# Patient Record
Sex: Female | Born: 1970 | Race: Black or African American | Hispanic: No | Smoking: Current every day smoker
Health system: Southern US, Community
[De-identification: ages and names within clinical notes are randomized; demographics above are authoritative.]

## PROBLEM LIST (undated history)

## (undated) DIAGNOSIS — M549 Dorsalgia, unspecified: Secondary | ICD-10-CM

## (undated) DIAGNOSIS — E039 Hypothyroidism, unspecified: Secondary | ICD-10-CM

## (undated) DIAGNOSIS — D649 Anemia, unspecified: Secondary | ICD-10-CM

## (undated) DIAGNOSIS — F411 Generalized anxiety disorder: Secondary | ICD-10-CM

## (undated) DIAGNOSIS — E0590 Thyrotoxicosis, unspecified without thyrotoxic crisis or storm: Secondary | ICD-10-CM

## (undated) DIAGNOSIS — F419 Anxiety disorder, unspecified: Secondary | ICD-10-CM

## (undated) DIAGNOSIS — R0602 Shortness of breath: Secondary | ICD-10-CM

## (undated) DIAGNOSIS — R079 Chest pain, unspecified: Secondary | ICD-10-CM

## (undated) DIAGNOSIS — M2550 Pain in unspecified joint: Secondary | ICD-10-CM

## (undated) DIAGNOSIS — H04129 Dry eye syndrome of unspecified lacrimal gland: Secondary | ICD-10-CM

## (undated) DIAGNOSIS — E0500 Thyrotoxicosis with diffuse goiter without thyrotoxic crisis or storm: Secondary | ICD-10-CM

## (undated) DIAGNOSIS — F32A Depression, unspecified: Secondary | ICD-10-CM

## (undated) HISTORY — PX: TUBAL LIGATION: SHX77

## (undated) HISTORY — DX: Chest pain, unspecified: R07.9

## (undated) HISTORY — DX: Dry eye syndrome of unspecified lacrimal gland: H04.129

## (undated) HISTORY — DX: Shortness of breath: R06.02

## (undated) HISTORY — DX: Anemia, unspecified: D64.9

## (undated) HISTORY — DX: Depression, unspecified: F32.A

## (undated) HISTORY — DX: Generalized anxiety disorder: F41.1

## (undated) HISTORY — DX: Anxiety disorder, unspecified: F41.9

## (undated) HISTORY — DX: Pain in unspecified joint: M25.50

## (undated) HISTORY — DX: Thyrotoxicosis with diffuse goiter without thyrotoxic crisis or storm: E05.00

## (undated) HISTORY — DX: Dorsalgia, unspecified: M54.9

## (undated) HISTORY — DX: Thyrotoxicosis, unspecified without thyrotoxic crisis or storm: E05.90

---

## 2013-11-23 DIAGNOSIS — M25519 Pain in unspecified shoulder: Secondary | ICD-10-CM | POA: Insufficient documentation

## 2013-11-30 DIAGNOSIS — E559 Vitamin D deficiency, unspecified: Secondary | ICD-10-CM | POA: Insufficient documentation

## 2016-02-20 DIAGNOSIS — E0590 Thyrotoxicosis, unspecified without thyrotoxic crisis or storm: Secondary | ICD-10-CM | POA: Insufficient documentation

## 2016-07-04 DIAGNOSIS — E0500 Thyrotoxicosis with diffuse goiter without thyrotoxic crisis or storm: Secondary | ICD-10-CM | POA: Insufficient documentation

## 2016-07-04 DIAGNOSIS — H05839 Thyroid orbitopathy, unspecified orbit: Secondary | ICD-10-CM | POA: Insufficient documentation

## 2018-12-04 ENCOUNTER — Other Ambulatory Visit: Payer: Self-pay

## 2018-12-04 ENCOUNTER — Encounter (HOSPITAL_COMMUNITY): Payer: Self-pay | Admitting: *Deleted

## 2018-12-04 ENCOUNTER — Emergency Department (HOSPITAL_COMMUNITY)
Admission: EM | Admit: 2018-12-04 | Discharge: 2018-12-05 | Disposition: A | Discharge status: Home / Self Care | Payer: Federal, State, Local not specified - PPO | Attending: Emergency Medicine | Admitting: Emergency Medicine

## 2018-12-04 ENCOUNTER — Emergency Department (HOSPITAL_COMMUNITY): Payer: Federal, State, Local not specified - PPO

## 2018-12-04 DIAGNOSIS — J181 Lobar pneumonia, unspecified organism: Secondary | ICD-10-CM | POA: Insufficient documentation

## 2018-12-04 DIAGNOSIS — F17200 Nicotine dependence, unspecified, uncomplicated: Secondary | ICD-10-CM | POA: Insufficient documentation

## 2018-12-04 DIAGNOSIS — R0602 Shortness of breath: Secondary | ICD-10-CM | POA: Diagnosis present

## 2018-12-04 DIAGNOSIS — E039 Hypothyroidism, unspecified: Secondary | ICD-10-CM | POA: Insufficient documentation

## 2018-12-04 DIAGNOSIS — J189 Pneumonia, unspecified organism: Secondary | ICD-10-CM

## 2018-12-04 HISTORY — DX: Hypothyroidism, unspecified: E03.9

## 2018-12-04 LAB — I-STAT BETA HCG BLOOD, ED (MC, WL, AP ONLY)

## 2018-12-04 LAB — CBC
HCT: 38.3 % (ref 36.0–46.0)
Hemoglobin: 12.3 g/dL (ref 12.0–15.0)
MCH: 29.5 pg (ref 26.0–34.0)
MCHC: 32.1 g/dL (ref 30.0–36.0)
MCV: 91.8 fL (ref 80.0–100.0)
Platelets: 404 10*3/uL — ABNORMAL HIGH (ref 150–400)
RBC: 4.17 MIL/uL (ref 3.87–5.11)
RDW: 13.9 % (ref 11.5–15.5)
WBC: 6.5 10*3/uL (ref 4.0–10.5)
nRBC: 0 % (ref 0.0–0.2)

## 2018-12-04 LAB — BASIC METABOLIC PANEL
Anion gap: 9 (ref 5–15)
BUN: 10 mg/dL (ref 6–20)
CO2: 24 mmol/L (ref 22–32)
Calcium: 9.4 mg/dL (ref 8.9–10.3)
Chloride: 106 mmol/L (ref 98–111)
Creatinine, Ser: 1.08 mg/dL — ABNORMAL HIGH (ref 0.44–1.00)
GFR calc Af Amer: >60 mL/min (ref >60)
GFR calc non Af Amer: >60 mL/min (ref >60)
Glucose, Bld: 118 mg/dL — ABNORMAL HIGH (ref 70–99)
POTASSIUM: 4.3 mmol/L (ref 3.5–5.1)
Sodium: 139 mmol/L (ref 135–145)

## 2018-12-04 MED ORDER — IPRATROPIUM-ALBUTEROL 0.5-2.5 (3) MG/3ML IN SOLN
3.0000 mL | Freq: Once | RESPIRATORY_TRACT | Status: AC
  Administered 2018-12-05: 3 mL via RESPIRATORY_TRACT
  Filled 2018-12-04: qty 3

## 2018-12-04 MED ORDER — IPRATROPIUM-ALBUTEROL 0.5-2.5 (3) MG/3ML IN SOLN
3.0000 mL | Freq: Once | RESPIRATORY_TRACT | Status: AC
  Administered 2018-12-04: 3 mL via RESPIRATORY_TRACT
  Filled 2018-12-04: qty 3

## 2018-12-04 MED ORDER — DOXYCYCLINE HYCLATE 100 MG PO CAPS: 100.0000 mg | ORAL_CAPSULE | Freq: Two times a day (BID) | ORAL | 0 refills | Status: DC

## 2018-12-04 MED ORDER — SODIUM CHLORIDE 0.9% FLUSH: 3.0000 mL | Freq: Once | INTRAVENOUS | Status: DC

## 2018-12-04 NOTE — Discharge Instructions (Signed)
Return to the ER for new or worsening symptoms. °

## 2018-12-04 NOTE — ED Notes (Signed)
Patient transported to X-ray 

## 2018-12-04 NOTE — ED Provider Notes (Signed)
[facility] EMERGENCY DEPARTMENT Provider Note   CSN: [id number] Arrival date & time: 12/04/18  2149     History   Chief Complaint Chief Complaint  Patient presents with  . Shortness of Breath    HPI Mary Crosby is a 48 y.o. female.  Patient presents to the emergency department with a chief complaint of shortness of breath and productive cough x2 days.  She states that she was seen in urgent care earlier today and was diagnosed with left lower lobe pneumonia, but was advised to come to the emergency department for CT scan of her chest to rule out PE.  She does not have any history of PE or DVT.  She denies any recent travel, immobilization, leg swelling, or pain in her calves.  She states that she does have dyspnea with exertion, but this is been ongoing for the same amount of time that she has been sick.  She also reports having sinus pressure and an earache.  She was given a shot of dexamethasone at the urgent care.  The history is provided by the patient. No language interpreter was used.    Past Medical History:  Diagnosis Date  . Hypothyroidism     There are no active problems to display for this patient.   History reviewed. No pertinent surgical history.   OB History   No obstetric history on file.      Home Medications    Prior to Admission medications   Not on File    Family History No family history on file.  Social History Social History   Tobacco Use  . Smoking status: Current Every Day Smoker  . Smokeless tobacco: Never Used  Substance Use Topics  . Alcohol use: Not Currently    Frequency: Never  . Drug use: Never     Allergies   Penicillins   Review of Systems Review of Systems  All other systems reviewed and are negative.    Physical Exam Updated Vital Signs BP (!) 129/94   Pulse 93   Temp 98.2 F (36.8 C)   Resp 20   SpO2 98%   Physical Exam Vitals signs and nursing note reviewed.    Constitutional:      Appearance: She is well-developed.  HENT:     Head: Normocephalic and atraumatic.  Eyes:     Conjunctiva/sclera: Conjunctivae normal.     Pupils: Pupils are equal, round, and reactive to light.  Neck:     Musculoskeletal: Normal range of motion and neck supple.  Cardiovascular:     Rate and Rhythm: Normal rate and regular rhythm.     Heart sounds: No murmur. No friction rub. No gallop.   Pulmonary:     Effort: Pulmonary effort is normal. No respiratory distress.     Breath sounds: Rhonchi present. No rales.  Chest:     Chest wall: No tenderness.  Abdominal:     General: Bowel sounds are normal. There is no distension.     Palpations: Abdomen is soft. There is no mass.     Tenderness: There is no abdominal tenderness. There is no guarding or rebound.  Musculoskeletal: Normal range of motion.        General: No tenderness.     Comments: No lower extremity swelling  Skin:    General: Skin is warm and dry.  Neurological:     Mental Status: She is alert and oriented to person, place, and time.  Psychiatric:  Behavior: Behavior normal.        Thought Content: Thought content normal.        Judgment: Judgment normal.      ED Treatments / Results  Labs (all labs ordered are listed, but only abnormal results are displayed) Labs Reviewed  CBC - Abnormal; Notable for the following components:      Result Value   Platelets 404 (*)    All other components within normal limits  BASIC METABOLIC PANEL  I-STAT BETA HCG BLOOD, ED ([facility], [facility], [facility] ONLY)    EKG EKG Interpretation  Date/Time:  Thursday December 04 2018 21:56:38 EST Ventricular Rate:  95 PR Interval:  150 QRS Duration: 76 QT Interval:  352 QTC Calculation: 442 R Axis:   -6 Text Interpretation:  Normal sinus rhythm Cannot rule out Anterior infarct , age undetermined Abnormal ECG No old tracing to compare Confirmed by Jacalyn LefevreHaviland, Julie [id number]) on 12/04/2018 10:33:16 PM   Radiology Dg Chest 2  View  Result Date: 12/04/2018 CLINICAL DATA:  48 year old female with cough. Diagnosed with pneumonia today. Smoker. EXAM: CHEST - 2 VIEW COMPARISON:  None. FINDINGS: Lung volumes and mediastinal contours are within normal limits. There is mild tortuosity of the thoracic aorta. There does appear to be asymmetric streaky opacity at the medial left lung base on the PA view. Superimposed mild diffuse increased interstitial opacity might be smoking related. No other confluent opacity. No pleural effusion. Visualized tracheal air column is within normal limits. No acute osseous abnormality identified. Negative visible bowel gas pattern. IMPRESSION: 1. Subtle streaky opacity in the left lower lobe could reflect Bronchopneumonia. There is no pleural effusion. 2. Superimposed diffuse increased pulmonary interstitial opacity may be smoking related, or alternatively could reflect acute viral/atypical respiratory infection. Electronically Signed   By: Odessa FlemingH  Hall M.D.   On: 12/04/2018 23:33    Procedures Procedures (including critical care time)  Medications Ordered in ED Medications  sodium chloride flush (NS) 0.9 % injection 3 mL (has no administration in time range)  ipratropium-albuterol (DUONEB) 0.5-2.5 (3) MG/3ML nebulizer solution 3 mL (3 mLs Nebulization Given 12/04/18 2244)     Initial Impression / Assessment and Plan / ED Course  I have reviewed the triage vital signs and the nursing notes.  Pertinent labs & imaging results that were available during my care of the patient were reviewed by me and considered in my medical decision making (see chart for details).     Patient with productive cough, sinus congestion, and earache x2 days.  Denies any fevers.  Was seen in urgent care, and tested negative for influenza.  Was sent to the ED over concern for pulmonary embolus.  Patient was thought to have a left lower lobe pneumonia on chest x-ray in the urgent care.  I will repeat this.  PE does not seem  consistent with the patient's symptoms, she has had sinus congestion and earache.  She is not hypoxic nor tachycardic.  She has no history of PE or DVT.  She is a smoker, but otherwise has no known risk factors for PE or DVT.  I believe that if a pneumonia is found on her chest x-ray this could reasonably describe her dyspnea on exertion.  Will check imaging and labs and reassess.  EKG shows no ischemic changes.  CXR shows findings of probable bronchopneumonia as well as possible viral process.  I doubt PE.  Patient discussed with Dr. Particia NearingHaviland, who has reviewed CXR and labs and agrees with plan to treat  for pneumonia and not pursue PE at this time.  Patient gave verbal consent allowing for me to interview them and deliver results in front of any visitors present.   Final Clinical Impressions(s) / ED Diagnoses   Final diagnoses:  Community acquired pneumonia of left lower lobe of lung [facility])    ED Discharge Orders         Ordered    doxycycline (VIBRAMYCIN) 100 MG capsule  2 times daily     12/04/18 2357           Roxy Horseman, PA-C 12/04/18 2358    Jacalyn Lefevre, MD 12/04/18 2359

## 2018-12-04 NOTE — ED Triage Notes (Addendum)
Pt was seen at [facility] and sent to ER for CT of her chest. C/o sob x 2 days with productive cough. Pt reports at UC she was told she has LLL pneumonia and "some other abnomality, maybe COPD." Wheezing noted. Has chest pain but feels it's related to her breathing. Reports she was flu -; did received steroid injection

## 2020-04-09 IMAGING — DX DG CHEST 2V
2 series · 2 of 2 positions shown · non-contrast
Comparison: None.

CLINICAL DATA: 47-year-old female with cough. Diagnosed with
pneumonia today. Smoker.

EXAM:
CHEST - 2 VIEW

[w chest pa]
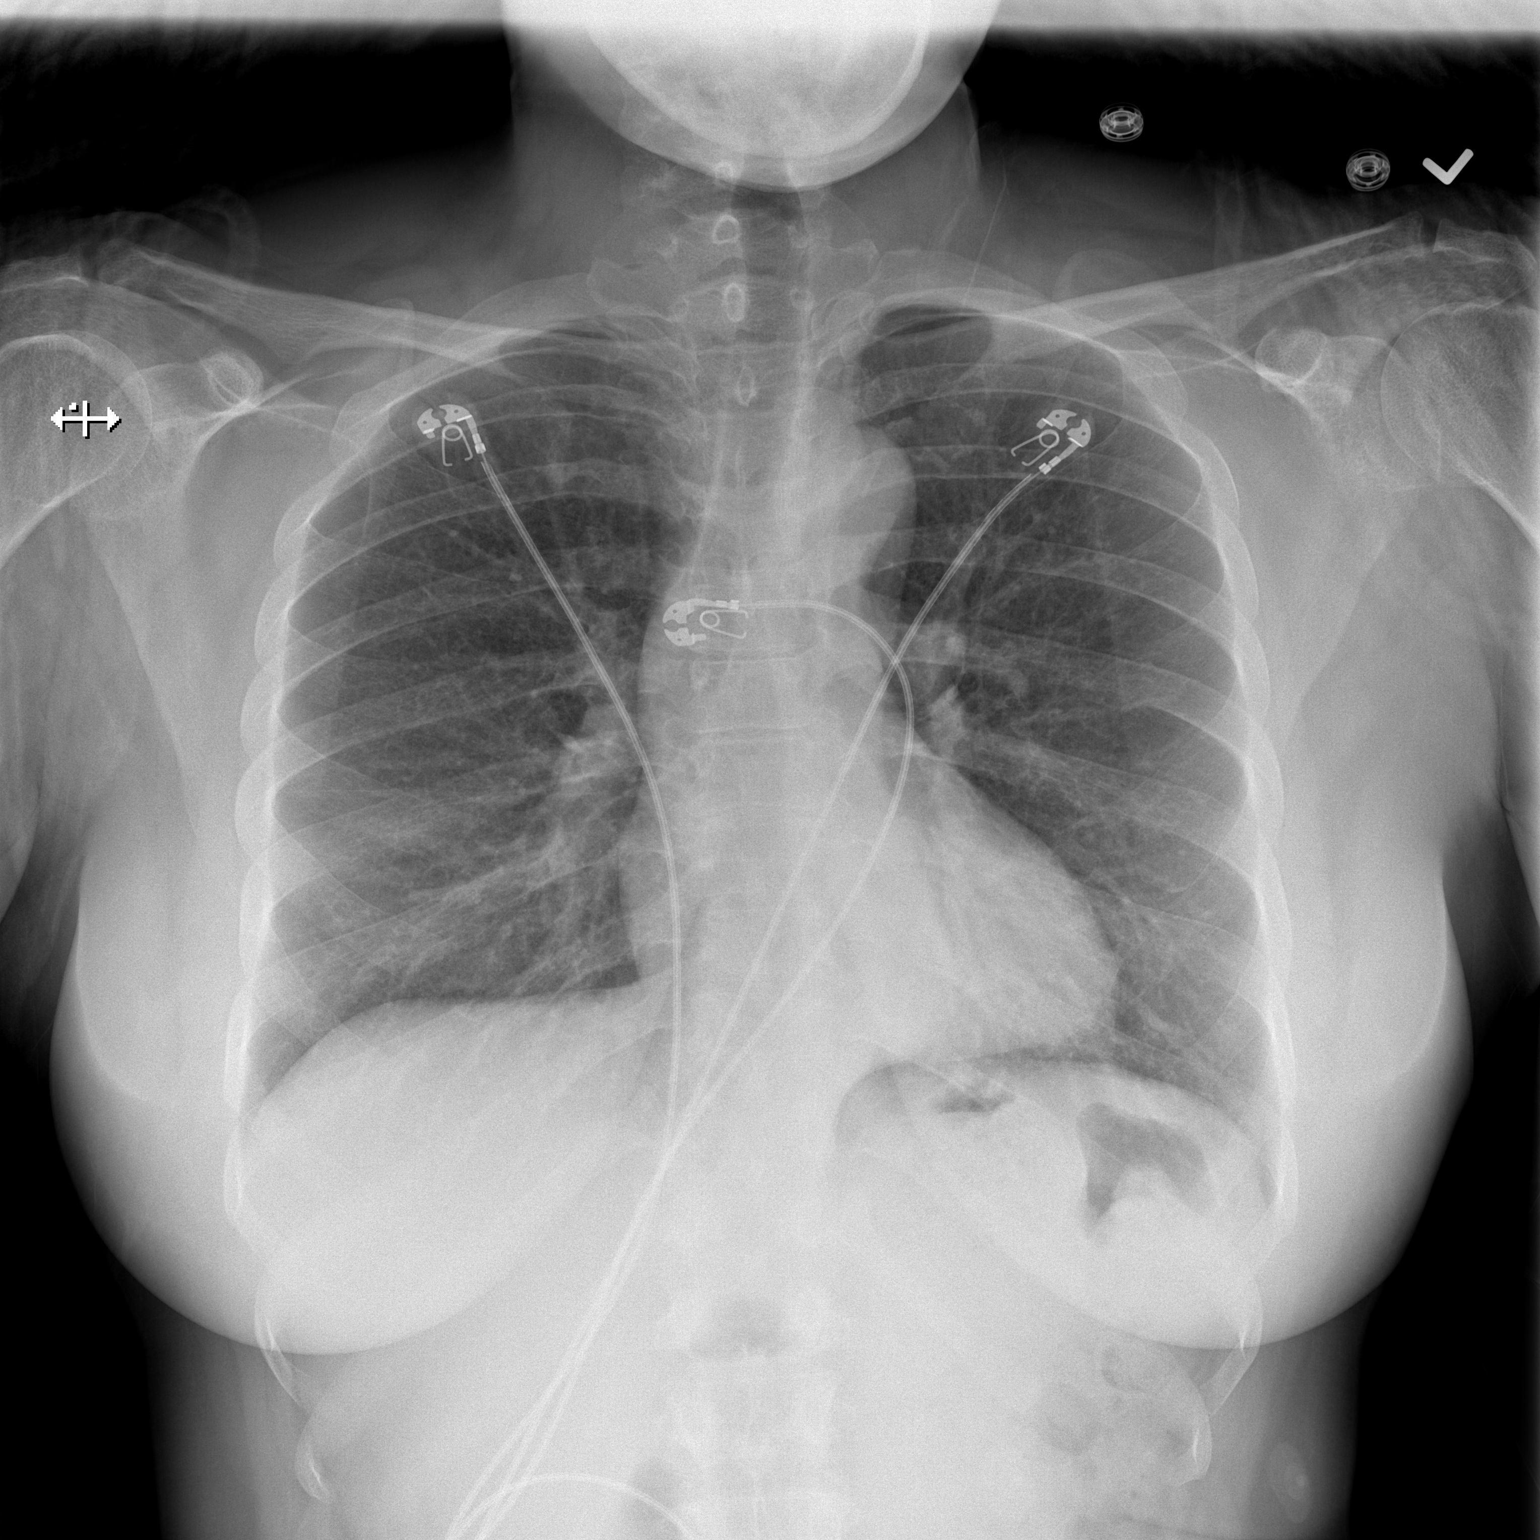

[w chest lat]
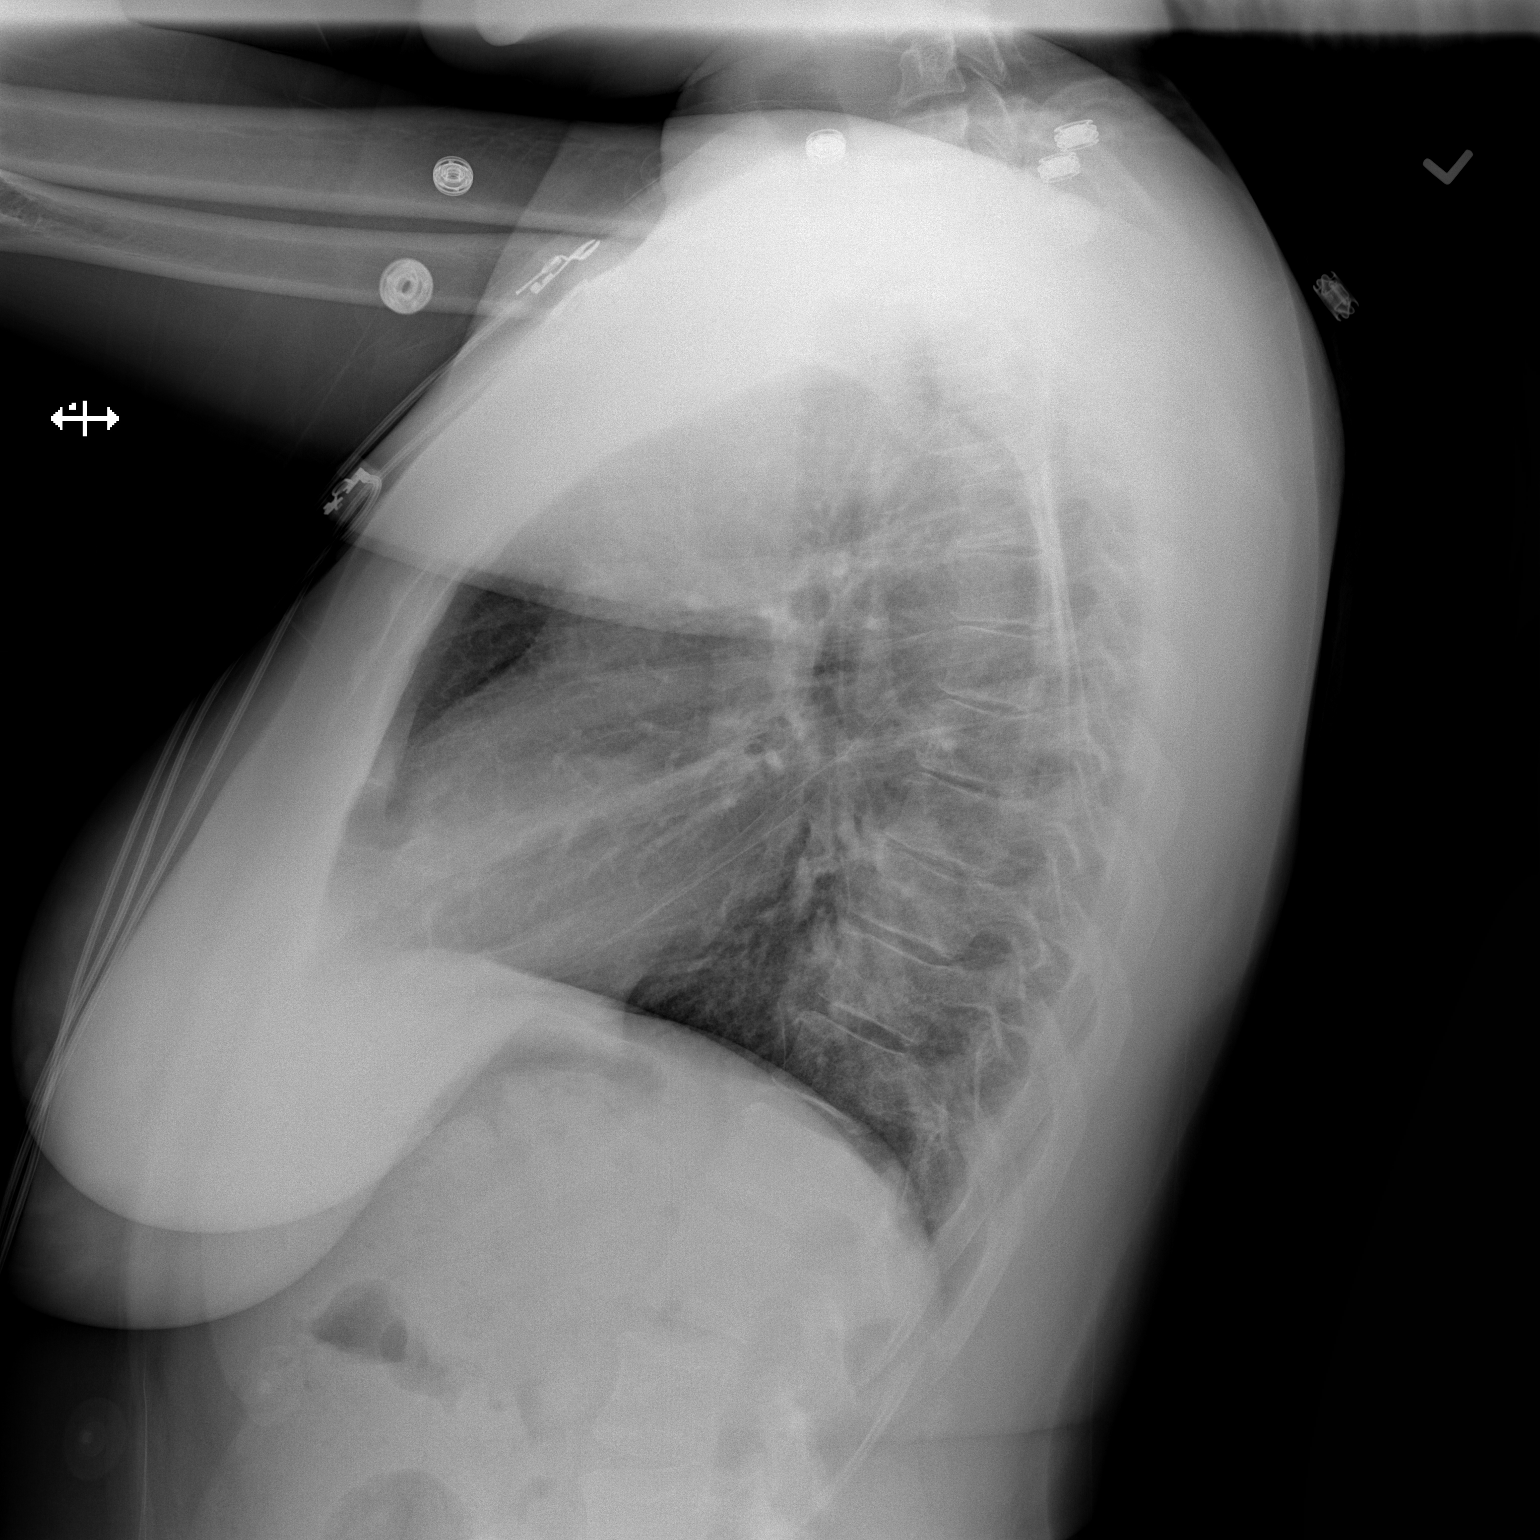

[2 of 2 positions shown; findings below may reference images not displayed]

FINDINGS: Lung volumes and mediastinal contours are within normal limits.
There is mild tortuosity of the thoracic aorta.

There does appear to be asymmetric streaky opacity at the medial
left lung base on the PA view. Superimposed mild diffuse increased
interstitial opacity might be smoking related. No other confluent
opacity. No pleural effusion. Visualized tracheal air column is
within normal limits.

No acute osseous abnormality identified. Negative visible bowel gas
pattern.
IMPRESSION: 1. Subtle streaky opacity in the left lower lobe could reflect
Bronchopneumonia. There is no pleural effusion.
2. Superimposed diffuse increased pulmonary interstitial opacity may
be smoking related, or alternatively could reflect acute
viral/atypical respiratory infection.

## 2021-09-28 DIAGNOSIS — E0590 Thyrotoxicosis, unspecified without thyrotoxic crisis or storm: Secondary | ICD-10-CM | POA: Diagnosis not present

## 2021-09-28 DIAGNOSIS — E079 Disorder of thyroid, unspecified: Secondary | ICD-10-CM | POA: Diagnosis not present

## 2021-09-28 DIAGNOSIS — H04123 Dry eye syndrome of bilateral lacrimal glands: Secondary | ICD-10-CM | POA: Diagnosis not present

## 2021-10-04 DIAGNOSIS — D539 Nutritional anemia, unspecified: Secondary | ICD-10-CM | POA: Diagnosis not present

## 2021-10-04 DIAGNOSIS — Z7251 High risk heterosexual behavior: Secondary | ICD-10-CM | POA: Diagnosis not present

## 2021-10-04 DIAGNOSIS — R5383 Other fatigue: Secondary | ICD-10-CM | POA: Diagnosis not present

## 2021-10-04 DIAGNOSIS — N898 Other specified noninflammatory disorders of vagina: Secondary | ICD-10-CM | POA: Diagnosis not present

## 2021-10-04 DIAGNOSIS — Z131 Encounter for screening for diabetes mellitus: Secondary | ICD-10-CM | POA: Diagnosis not present

## 2021-10-04 DIAGNOSIS — Z3200 Encounter for pregnancy test, result unknown: Secondary | ICD-10-CM | POA: Diagnosis not present

## 2021-10-04 DIAGNOSIS — E559 Vitamin D deficiency, unspecified: Secondary | ICD-10-CM | POA: Diagnosis not present

## 2021-10-04 DIAGNOSIS — Z6831 Body mass index (BMI) 31.0-31.9, adult: Secondary | ICD-10-CM | POA: Diagnosis not present

## 2021-10-04 DIAGNOSIS — R053 Chronic cough: Secondary | ICD-10-CM | POA: Diagnosis not present

## 2021-10-04 DIAGNOSIS — R0602 Shortness of breath: Secondary | ICD-10-CM | POA: Diagnosis not present

## 2021-10-04 DIAGNOSIS — Z0000 Encounter for general adult medical examination without abnormal findings: Secondary | ICD-10-CM | POA: Diagnosis not present

## 2021-10-04 DIAGNOSIS — R8290 Unspecified abnormal findings in urine: Secondary | ICD-10-CM | POA: Diagnosis not present

## 2021-10-04 DIAGNOSIS — F17200 Nicotine dependence, unspecified, uncomplicated: Secondary | ICD-10-CM | POA: Diagnosis not present

## 2021-10-04 DIAGNOSIS — Z1159 Encounter for screening for other viral diseases: Secondary | ICD-10-CM | POA: Diagnosis not present

## 2021-10-04 DIAGNOSIS — F17210 Nicotine dependence, cigarettes, uncomplicated: Secondary | ICD-10-CM | POA: Diagnosis not present

## 2021-10-04 DIAGNOSIS — Z20822 Contact with and (suspected) exposure to covid-19: Secondary | ICD-10-CM | POA: Diagnosis not present

## 2021-10-14 DIAGNOSIS — B009 Herpesviral infection, unspecified: Secondary | ICD-10-CM | POA: Diagnosis not present

## 2021-10-14 DIAGNOSIS — Z780 Asymptomatic menopausal state: Secondary | ICD-10-CM | POA: Diagnosis not present

## 2021-10-14 DIAGNOSIS — E0590 Thyrotoxicosis, unspecified without thyrotoxic crisis or storm: Secondary | ICD-10-CM | POA: Diagnosis not present

## 2021-10-14 DIAGNOSIS — N760 Acute vaginitis: Secondary | ICD-10-CM | POA: Diagnosis not present

## 2021-11-15 DIAGNOSIS — E0500 Thyrotoxicosis with diffuse goiter without thyrotoxic crisis or storm: Secondary | ICD-10-CM | POA: Diagnosis not present

## 2021-11-23 ENCOUNTER — Encounter: Payer: Federal, State, Local not specified - PPO | Admitting: Obstetrics and Gynecology

## 2022-05-15 DIAGNOSIS — E0500 Thyrotoxicosis with diffuse goiter without thyrotoxic crisis or storm: Secondary | ICD-10-CM | POA: Diagnosis not present

## 2022-05-17 DIAGNOSIS — E0500 Thyrotoxicosis with diffuse goiter without thyrotoxic crisis or storm: Secondary | ICD-10-CM | POA: Diagnosis not present

## 2022-07-12 DIAGNOSIS — R1030 Lower abdominal pain, unspecified: Secondary | ICD-10-CM | POA: Diagnosis not present

## 2022-07-12 DIAGNOSIS — R300 Dysuria: Secondary | ICD-10-CM | POA: Diagnosis not present

## 2022-07-12 DIAGNOSIS — Z79899 Other long term (current) drug therapy: Secondary | ICD-10-CM | POA: Diagnosis not present

## 2022-07-12 DIAGNOSIS — M545 Low back pain, unspecified: Secondary | ICD-10-CM | POA: Diagnosis not present

## 2022-07-12 DIAGNOSIS — R531 Weakness: Secondary | ICD-10-CM | POA: Diagnosis not present

## 2022-07-12 DIAGNOSIS — E0500 Thyrotoxicosis with diffuse goiter without thyrotoxic crisis or storm: Secondary | ICD-10-CM | POA: Diagnosis not present

## 2022-07-12 DIAGNOSIS — F17210 Nicotine dependence, cigarettes, uncomplicated: Secondary | ICD-10-CM | POA: Diagnosis not present

## 2022-07-12 DIAGNOSIS — Z880 Allergy status to penicillin: Secondary | ICD-10-CM | POA: Diagnosis not present

## 2022-08-28 DIAGNOSIS — E0500 Thyrotoxicosis with diffuse goiter without thyrotoxic crisis or storm: Secondary | ICD-10-CM | POA: Diagnosis not present

## 2022-12-10 ENCOUNTER — Ambulatory Visit
Admission: EM | Admit: 2022-12-10 | Discharge: 2022-12-10 | Disposition: A | Discharge status: Home / Self Care | Payer: Federal, State, Local not specified - PPO | Attending: Family Medicine | Admitting: Family Medicine

## 2022-12-10 DIAGNOSIS — H53143 Visual discomfort, bilateral: Secondary | ICD-10-CM | POA: Diagnosis not present

## 2022-12-10 DIAGNOSIS — H109 Unspecified conjunctivitis: Secondary | ICD-10-CM | POA: Diagnosis not present

## 2022-12-10 MED ORDER — NEOMYCIN-POLYMYXIN-DEXAMETH 3.5-10000-0.1 OP SUSP: 1.0000 [drp] | OPHTHALMIC | 0 refills | Status: AC

## 2022-12-10 NOTE — ED Provider Notes (Signed)
[facility]    CSN: [id number] Arrival date & time: 12/10/22  1523      History   Chief Complaint Chief Complaint  Patient presents with   Eye Problem    Bilateral    HPI Mary Crosby is a 52 y.o. female.   HPI  Patient has a history of Graves' disease, hyperthyroidism, thyroid eye disease She states that she does not wear glasses or corrective lenses Has not seen her ophthalmologist since 2022 She states she started with eye irritation almost a month ago with a lot of tearing and itching.  Thought it might be allergies.  Over the last week to week and a half she has had more photophobia.  Yellow crusting in the lashes.  Matting of the eyes in the morning.  Today at work she had to blink frequently because of a feeling of eye blurriness.  No runny nose or cold symptoms.  Past Medical History:  Diagnosis Date   Hypothyroidism     There are no problems to display for this patient.   History reviewed. No pertinent surgical history.  OB History   No obstetric history on file.      Home Medications    Prior to Admission medications   Medication Sig Start Date End Date Taking? Authorizing Provider  neomycin-polymyxin b-dexamethasone (MAXITROL) 3.5-10000-0.1 SUSP Place 1 drop into the right eye every 4 (four) hours for 5 days. 12/10/22 12/15/22 Yes Raylene Everts, MD  Doxylamine-DM [facility] DAYQUIL/NYQUIL COUGH PO) Take 1-2 capsules by mouth every 8 (eight) hours as needed (for chest congestion).    [provider]  ibuprofen (ADVIL,MOTRIN) 200 MG tablet Take 200-400 mg by mouth every 6 (six) hours as needed (for pain or cramps).    [provider]  methimazole (TAPAZOLE) 10 MG tablet Take 5 mg by mouth daily. 11/21/18   [provider]  naproxen sodium (ALEVE) 220 MG tablet Take 440 mg by mouth 2 (two) times daily as needed (for headaches).    [provider]    Family History History reviewed. No pertinent family  history.  Social History Social History   Tobacco Use   Smoking status: Every Day   Smokeless tobacco: Never  Substance Use Topics   Alcohol use: Not Currently   Drug use: Never     Allergies   Penicillins   Review of Systems Review of Systems See HPI  Physical Exam Triage Vital Signs ED Triage Vitals  Enc Vitals Group     BP 12/10/22 1536 131/85     Pulse Rate 12/10/22 1536 81     Resp 12/10/22 1536 18     Temp 12/10/22 1536 98.3 F (36.8 C)     Temp Source 12/10/22 1536 Oral     SpO2 12/10/22 1536 97 %     Weight --      Height --      Head Circumference --      Peak Flow --      Pain Score 12/10/22 1538 0     Pain Loc --      Pain Edu? --      Excl. in [place]? --    No data found.  Updated Vital Signs BP 131/85 (BP Location: Right Arm)   Pulse 81   Temp 98.3 F (36.8 C) (Oral)   Resp 18   SpO2 97%      Physical Exam Constitutional:      General: She is not in acute distress.  Appearance: She is well-developed.  HENT:     Head: Normocephalic and atraumatic.  Eyes:     Conjunctiva/sclera: Conjunctivae normal.     Pupils: Pupils are equal, round, and reactive to light.     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  Conjunctival injection bilaterally.  Yellow mucus on lashes.  Slight tearing.  Vision grossly intact.  Tetracaine instilled and then fluorescein exam.  It is negative  Cardiovascular:     Rate and Rhythm: Normal rate.  Pulmonary:     Effort: Pulmonary effort is normal. No respiratory distress.  Abdominal:     General: There is no distension.     Palpations: Abdomen is soft.  Musculoskeletal:        General: Normal range of motion.     Cervical back: Normal range of motion.  Skin:    General: Skin is warm and dry.  Neurological:     Mental Status: She is alert.      UC Treatments / Results  Labs (all labs ordered are listed, but only abnormal results are displayed) Labs Reviewed - No data to  display  EKG   Radiology No results found.  Procedures Procedures (including critical care time)  Medications Ordered in UC Medications - No data to display  Initial Impression / Assessment and Plan / UC Course  I have reviewed the triage vital signs and the nursing notes.  Pertinent labs & imaging results that were available during my care of the patient were reviewed by me and considered in my medical decision making (see chart for details).     Emphasized the importance of getting ophthalmology care if she fails to improve within 48 hours of eyedrop treatment. Final Clinical Impressions(s) / UC Diagnoses   Final diagnoses:  Conjunctivitis of both eyes, unspecified conjunctivitis type  Photophobia of both eyes     Discharge Instructions      Use eyedrops as directed May use cool compresses for comfort See an eye doctor if you do not improve by the end of the week Call here if you need help getting an eye appointment   ED Prescriptions     Medication Sig Dispense Auth. Provider   neomycin-polymyxin b-dexamethasone (MAXITROL) 3.5-10000-0.1 SUSP Place 1 drop into the right eye every 4 (four) hours for 5 days. 1.5 mL Raylene Everts, MD      PDMP not reviewed this encounter.   Raylene Everts, MD 12/10/22 [phone]

## 2022-12-10 NOTE — ED Triage Notes (Signed)
Pt c/o bilateral eye problem x 1 month. Lots of drainage and redness. Vision started to become blurry a couple days ago. Does wear corrective lenses. Hx of hyperthyroidism and graves disease.

## 2022-12-10 NOTE — Discharge Instructions (Signed)
Use eyedrops as directed May use cool compresses for comfort See an eye doctor if you do not improve by the end of the week Call here if you need help getting an eye appointment

## 2023-02-26 DIAGNOSIS — R1084 Generalized abdominal pain: Secondary | ICD-10-CM | POA: Diagnosis not present

## 2023-02-26 DIAGNOSIS — Z880 Allergy status to penicillin: Secondary | ICD-10-CM | POA: Diagnosis not present

## 2023-02-26 DIAGNOSIS — R112 Nausea with vomiting, unspecified: Secondary | ICD-10-CM | POA: Diagnosis not present

## 2023-02-26 DIAGNOSIS — R42 Dizziness and giddiness: Secondary | ICD-10-CM | POA: Diagnosis not present

## 2023-02-26 DIAGNOSIS — R1032 Left lower quadrant pain: Secondary | ICD-10-CM | POA: Diagnosis not present

## 2023-02-26 DIAGNOSIS — E0500 Thyrotoxicosis with diffuse goiter without thyrotoxic crisis or storm: Secondary | ICD-10-CM | POA: Diagnosis not present

## 2023-02-26 DIAGNOSIS — F17210 Nicotine dependence, cigarettes, uncomplicated: Secondary | ICD-10-CM | POA: Diagnosis not present

## 2023-02-26 DIAGNOSIS — K760 Fatty (change of) liver, not elsewhere classified: Secondary | ICD-10-CM | POA: Diagnosis not present

## 2023-02-26 DIAGNOSIS — R9431 Abnormal electrocardiogram [ECG] [EKG]: Secondary | ICD-10-CM | POA: Diagnosis not present

## 2023-02-26 DIAGNOSIS — Z79899 Other long term (current) drug therapy: Secondary | ICD-10-CM | POA: Diagnosis not present

## 2023-02-26 DIAGNOSIS — R197 Diarrhea, unspecified: Secondary | ICD-10-CM | POA: Diagnosis not present

## 2023-02-26 DIAGNOSIS — R1030 Lower abdominal pain, unspecified: Secondary | ICD-10-CM | POA: Diagnosis not present

## 2023-04-12 ENCOUNTER — Ambulatory Visit
Admission: EM | Admit: 2023-04-12 | Discharge: 2023-04-12 | Disposition: A | Discharge status: Home / Self Care | Payer: Federal, State, Local not specified - PPO | Attending: Family Medicine | Admitting: Family Medicine

## 2023-04-12 ENCOUNTER — Encounter: Payer: Self-pay | Admitting: Emergency Medicine

## 2023-04-12 DIAGNOSIS — M79605 Pain in left leg: Secondary | ICD-10-CM | POA: Diagnosis not present

## 2023-04-12 DIAGNOSIS — T148XXA Other injury of unspecified body region, initial encounter: Secondary | ICD-10-CM | POA: Diagnosis not present

## 2023-04-12 MED ORDER — IBUPROFEN 800 MG PO TABS: 800.0000 mg | ORAL_TABLET | Freq: Every day | ORAL | 0 refills | Status: DC | PRN

## 2023-04-12 NOTE — ED Notes (Signed)
Pt completing form online for new pcp appointment

## 2023-04-12 NOTE — ED Triage Notes (Signed)
Left leg pain started 5 days ago  Pt stand a lot at work but has been off the last 2 days Epsom salt soaks - no relief No OTC meds  Starts below buttock and radiates down to left foot Tingling & numbness to left foot  Pt has concerns for a blood clot & diabetes runs in her family

## 2023-04-12 NOTE — Discharge Instructions (Addendum)
Advised patient may take Ibuprofen daily or as needed for left leg pain.  Encouraged increase daily water intake to 64 ounces per day while taking this medication.  Advised if symptoms worsen and/or unresolved please follow-up with PCP or [facility] for further evaluation.

## 2023-04-12 NOTE — ED Provider Notes (Signed)
[facility]    CSN: [id number] Arrival date & time: 04/12/23  1218      History   Chief Complaint Chief Complaint  Patient presents with   Leg Pain    Left     HPI Mary Crosby is a 52 y.o. female.   HPI 52 year old female presents with left leg pain for 5 days.  Reports pain starts below the left buttocks and radiates to left foot with tingling and numbness of left foot.  Patient is concerned with possible blood clot and diabetes that runs in her family.  PMH significant for obesity, current everyday cigarette smoker, and hypothyroidism.  Patient is currently not followed by a PCP.   Past Medical History:  Diagnosis Date   Hypothyroidism     There are no problems to display for this patient.   History reviewed. No pertinent surgical history.  OB History   No obstetric history on file.      Home Medications    Prior to Admission medications   Medication Sig Start Date End Date Taking? Authorizing Provider  ibuprofen (ADVIL) 800 MG tablet Take 1 tablet (800 mg total) by mouth daily as needed. 04/12/23  Yes Trevor Iha, FNP  methimazole (TAPAZOLE) 10 MG tablet Take 5 mg by mouth daily. 11/21/18   [provider]    Family History Family History  Problem Relation Age of Onset   Diabetes Mother    Coronary artery disease Mother    Diabetes Father    Lung disease Father     Social History Social History   Tobacco Use   Smoking status: Every Day    Packs/day: 1.00    Years: 35.00    Additional pack years: 0.00    Total pack years: 35.00    Types: Cigarettes   Smokeless tobacco: Never  Vaping Use   Vaping Use: Never used  Substance Use Topics   Alcohol use: Not Currently   Drug use: Never     Allergies   Penicillins   Review of Systems Review of Systems  Musculoskeletal:        Left leg pain x 5 days that begins at left buttocks and radiates down to left foot reports numbness and tingling  All other systems  reviewed and are negative.    Physical Exam Triage Vital Signs ED Triage Vitals  Enc Vitals Group     BP 04/12/23 1245 111/79     Pulse Rate 04/12/23 1245 70     Resp 04/12/23 1245 16     Temp 04/12/23 1245 98.9 F (37.2 C)     Temp Source 04/12/23 1245 Oral     SpO2 04/12/23 1245 100 %     Weight 04/12/23 1249 202 lb (91.6 kg)     Height 04/12/23 1249 5\' 7"  (1.702 m)     Head Circumference --      Peak Flow --      Pain Score 04/12/23 1247 6     Pain Loc --      Pain Edu? --      Excl. in [place]? --    No data found.  Updated Vital Signs BP 111/79 (BP Location: Left Arm)   Pulse 70   Temp 98.9 F (37.2 C) (Oral)   Resp 16   Ht 5\' 7"  (1.702 m)   Wt 202 lb (91.6 kg)   SpO2 100%   BMI 31.64 kg/m   Physical Exam Vitals and nursing note reviewed.  Constitutional:  Appearance: Normal appearance. She is obese.  HENT:     Head: Normocephalic and atraumatic.     Mouth/Throat:     Mouth: Mucous membranes are moist.     Pharynx: Oropharynx is clear.  Eyes:     Extraocular Movements: Extraocular movements intact.     Conjunctiva/sclera: Conjunctivae normal.     Pupils: Pupils are equal, round, and reactive to light.  Cardiovascular:     Rate and Rhythm: Normal rate and regular rhythm.     Pulses: Normal pulses.     Heart sounds: Normal heart sounds.  Pulmonary:     Effort: Pulmonary effort is normal.     Breath sounds: Normal breath sounds. No wheezing, rhonchi or rales.  Musculoskeletal:        General: Normal range of motion.     Cervical back: Normal range of motion and neck supple.     Comments: Posterior left upper leg: Patient reports muscle skeletal like pain that has improved over the past 2 days.  Skin:    General: Skin is warm and dry.  Neurological:     General: No focal deficit present.     Mental Status: She is alert and oriented to person, place, and time. Mental status is at baseline.  Psychiatric:        Mood and Affect: Mood normal.         Behavior: Behavior normal.        Thought Content: Thought content normal.      UC Treatments / Results  Labs (all labs ordered are listed, but only abnormal results are displayed) Labs Reviewed - No data to display  EKG   Radiology No results found.  Procedures Procedures (including critical care time)  Medications Ordered in UC Medications - No data to display  Initial Impression / Assessment and Plan / UC Course  I have reviewed the triage vital signs and the nursing notes.  Pertinent labs & imaging results that were available during my care of the patient were reviewed by me and considered in my medical decision making (see chart for details).     MDM: 1.  Left leg pain-Rx'd Ibuprofen 800 mg daily, as needed; 2.  Muscle strain-Rx'd Ibuprofen 800 mg daily, as needed. Advised patient may take Ibuprofen daily or as needed for left leg pain.  Encouraged increase daily water intake to 64 ounces per day while taking this medication.  Advised if symptoms worsen and/or unresolved please follow-up with PCP or [facility] for further evaluation.  Patient discharged home, hemodynamically stable.  Patient discharged home, hemodynamically stable.  Final Clinical Impressions(s) / UC Diagnoses   Final diagnoses:  Left leg pain  Muscle strain     Discharge Instructions      Advised patient may take Ibuprofen daily or as needed for left leg pain.  Encouraged increase daily water intake to 64 ounces per day while taking this medication.  Advised if symptoms worsen and/or unresolved please follow-up with PCP or [facility] for further evaluation.     ED Prescriptions     Medication Sig Dispense Auth. Provider   ibuprofen (ADVIL) 800 MG tablet Take 1 tablet (800 mg total) by mouth daily as needed. 21 tablet Trevor Iha, FNP      PDMP not reviewed this encounter.   Trevor Iha, FNP 04/12/23 1330

## 2023-04-18 ENCOUNTER — Encounter: Payer: Self-pay | Admitting: Family Medicine

## 2023-04-18 ENCOUNTER — Ambulatory Visit: Payer: Federal, State, Local not specified - PPO | Admitting: Family Medicine

## 2023-04-18 ENCOUNTER — Ambulatory Visit (INDEPENDENT_AMBULATORY_CARE_PROVIDER_SITE_OTHER): Payer: Federal, State, Local not specified - PPO

## 2023-04-18 VITALS — BP 111/82 | HR 88 | Ht 67.0 in | Wt 202.0 lb

## 2023-04-18 DIAGNOSIS — Z7689 Persons encountering health services in other specified circumstances: Secondary | ICD-10-CM

## 2023-04-18 DIAGNOSIS — N951 Menopausal and female climacteric states: Secondary | ICD-10-CM

## 2023-04-18 DIAGNOSIS — Z0289 Encounter for other administrative examinations: Secondary | ICD-10-CM | POA: Insufficient documentation

## 2023-04-18 DIAGNOSIS — M79662 Pain in left lower leg: Secondary | ICD-10-CM | POA: Diagnosis not present

## 2023-04-18 DIAGNOSIS — F17210 Nicotine dependence, cigarettes, uncomplicated: Secondary | ICD-10-CM | POA: Insufficient documentation

## 2023-04-18 DIAGNOSIS — F321 Major depressive disorder, single episode, moderate: Secondary | ICD-10-CM

## 2023-04-18 DIAGNOSIS — M79605 Pain in left leg: Secondary | ICD-10-CM | POA: Diagnosis not present

## 2023-04-18 DIAGNOSIS — Z1211 Encounter for screening for malignant neoplasm of colon: Secondary | ICD-10-CM

## 2023-04-18 DIAGNOSIS — F411 Generalized anxiety disorder: Secondary | ICD-10-CM

## 2023-04-18 DIAGNOSIS — Z1231 Encounter for screening mammogram for malignant neoplasm of breast: Secondary | ICD-10-CM

## 2023-04-18 MED ORDER — BUPROPION HCL ER (XL) 150 MG PO TB24
150.0000 mg | ORAL_TABLET | Freq: Every day | ORAL | 1 refills | Status: DC
Start: 2023-04-18 — End: 2023-08-30

## 2023-04-18 NOTE — Progress Notes (Signed)
New Patient Office Visit  Subjective    Patient ID: Mary Crosby, female    DOB: Feb 16, 1971  Age: 52 y.o. MRN: [id number]  CC:  Chief Complaint  Patient presents with   Establish Care    HPI [name] presents to establish care with this practice. She is new to me.  History of Graves' disease. Last saw endocrine January. Has appointment scheduled later this month.   She has several concerns to discuss today.  04/12/23 went to ED for left leg pain: pain from left buttock to left ankle. Sore to touch, unable to walk on it. Was concerned for blood clot, ED did not do ultrasound. She was diagnosed with sciatica. Left calf pain and left thigh pain. No swelling, this has improved. Did not start ibuprofen. "I have a problem taking pills" tries not to take medication.   Depression and anxiety: no motivation, low energy, stressful job. Has not been on treatment recently. Interested in medication and referral for psychiatry.  Peri-Menopause: LMP 4 months ago. Having hot flashes and mood swings, agitated. Weight gain. Need to rule out other physical causes. Smoker, not a candidate for hormone therapy.  Needs colonoscopy- referral placed today Needs mammogram-referral placed today Pap: reports this was normal. Will get results sent to this office Need low dose CT: smokes 30 pack year smoker. Will order today.  Recent eye exam. Will get records sent to office.     Outpatient Encounter Medications as of 04/18/2023  Medication Sig   albuterol (VENTOLIN HFA) 108 (90 Base) MCG/ACT inhaler Inhale 1-2 puffs into the lungs every 6 (six) hours as needed for wheezing or shortness of breath.   buPROPion (WELLBUTRIN XL) 150 MG 24 hr tablet Take 1 tablet (150 mg total) by mouth daily.   ibuprofen (ADVIL) 200 MG tablet Take 200 mg by mouth every 4 (four) hours as needed for headache.   methimazole (TAPAZOLE) 5 MG tablet Take 5 mg by mouth daily.   [DISCONTINUED] buPROPion (WELLBUTRIN SR)  150 MG 12 hr tablet Take 150 mg by mouth daily.   [DISCONTINUED] predniSONE (DELTASONE) 10 MG tablet Take 10 mg by mouth taper from 4 doses each day to 1 dose and stop.   [DISCONTINUED] ibuprofen (ADVIL) 800 MG tablet Take 1 tablet (800 mg total) by mouth daily as needed.   [DISCONTINUED] methimazole (TAPAZOLE) 10 MG tablet Take 5 mg by mouth daily.   [DISCONTINUED] Omega-3 Fatty Acids (FISH OIL) 1000 MG CAPS Take 1,000 mg by mouth daily.   No facility-administered encounter medications on file as of 04/18/2023.    Past Medical History:  Diagnosis Date   Depression    Dry eye syndrome    GAD (generalized anxiety disorder)    Graves' disease    Hyperthyroidism     No past surgical history on file.  Family History  Problem Relation Age of Onset   Diabetes Mother    Coronary artery disease Mother    Diabetes Father    Lung disease Father     Social History   Socioeconomic History   Marital status: Married    Spouse name: Not on file   Number of children: Not on file   Years of education: Not on file   Highest education level: Not on file  Occupational History   Not on file  Tobacco Use   Smoking status: Every Day    Packs/day: 1.00    Years: 35.00    Additional pack years: 0.00    Total  pack years: 35.00    Types: Cigarettes   Smokeless tobacco: Never  Vaping Use   Vaping Use: Never used  Substance and Sexual Activity   Alcohol use: Not Currently   Drug use: Never   Sexual activity: Not on file  Other Topics Concern   Not on file  Social History Narrative   Not on file   Social Determinants of Health   Financial Resource Strain: Not on file  Food Insecurity: Not on file  Transportation Needs: Not on file  Physical Activity: Not on file  Stress: Not on file  Social Connections: Not on file  Intimate Partner Violence: Not on file    Review of Systems  Constitutional:  Negative for chills and fever.  Eyes:  Positive for blurred vision (recent eye exam  and got glasses, helps when she is wearing glasses.). Negative for double vision.  Respiratory:  Positive for shortness of breath (with walking, smokes 1 pack per day) and wheezing.   Cardiovascular:  Negative for chest pain.        Objective    BP 111/82   Pulse 88   Ht 5\' 7"  (1.702 m)   Wt 202 lb (91.6 kg)   SpO2 99%   BMI 31.64 kg/m   Physical Exam Vitals and nursing note reviewed.  Constitutional:      General: She is not in acute distress.    Appearance: Normal appearance.  Cardiovascular:     Rate and Rhythm: Normal rate and regular rhythm.     Heart sounds: Normal heart sounds.  Pulmonary:     Effort: Pulmonary effort is normal.     Breath sounds: Normal breath sounds.  Musculoskeletal:        General: Tenderness (left calf) present.  Skin:    General: Skin is warm and dry.  Neurological:     General: No focal deficit present.     Mental Status: She is alert. Mental status is at baseline.  Psychiatric:        Mood and Affect: Mood normal.        Behavior: Behavior normal.        Thought Content: Thought content normal.        Judgment: Judgment normal.        Assessment & Plan:   Establishing care with new doctor, encounter for  Screening for colon cancer -     Ambulatory referral to Gastroenterology  Encounter for screening mammogram for malignant neoplasm of breast -     Digital Screening Mammogram, Left and Right  Depression, major, single episode, moderate (HCC)  Will start Wellbutrin 150 mg XL daily for depression. Denies thoughts of self-harm. Referral placed for psychiatry due to multiple stressors in life. Follow-up in 4 weeks.  -     Ambulatory referral to Psychiatry -     buPROPion HCl ER (XL); Take 1 tablet (150 mg total) by mouth daily.  Dispense: 30 tablet; Refill: 1  GAD (generalized anxiety disorder) -     Ambulatory referral to Psychiatry  Peri-menopause  Pain of left calf  Referral specialist able to get appointment ASAP,  will head over now. She is high risk of DVT due to smoking status.  -     US Venous Img Lower Unilateral Left (DVT)  Smoking greater than 30 pack years -     CT CHEST LUNG CANCER SCREENING LOW DOSE WO CONTRAST    Agrees with plan of care discussed.  Questions answered. Encouraged My Chart  activation. She will go now to get ultrasound of left leg to rule out DVT.   Return in about 1 week (around 04/25/2023) for cpe with labs .   Novella Olive, FNP

## 2023-04-25 ENCOUNTER — Encounter: Payer: Federal, State, Local not specified - PPO | Admitting: Family Medicine

## 2023-05-01 ENCOUNTER — Encounter (HOSPITAL_COMMUNITY): Payer: Self-pay

## 2023-05-01 ENCOUNTER — Telehealth (HOSPITAL_COMMUNITY): Payer: Federal, State, Local not specified - PPO | Admitting: Psychiatry

## 2023-06-18 ENCOUNTER — Telehealth: Payer: Self-pay | Admitting: *Deleted

## 2023-06-18 ENCOUNTER — Ambulatory Visit (AMBULATORY_SURGERY_CENTER): Payer: Federal, State, Local not specified - PPO | Admitting: *Deleted

## 2023-06-18 VITALS — Ht 67.0 in | Wt 200.0 lb

## 2023-06-18 DIAGNOSIS — Z1211 Encounter for screening for malignant neoplasm of colon: Secondary | ICD-10-CM

## 2023-06-18 MED ORDER — NA SULFATE-K SULFATE-MG SULF 17.5-3.13-1.6 GM/177ML PO SOLN
1.0000 | Freq: Once | ORAL | 0 refills | Status: AC
Start: 2023-06-18 — End: 2023-06-18

## 2023-06-18 NOTE — Telephone Encounter (Signed)
Attempt to reach pt for pre-visit. LM with call back #.  Will attempt to reach again in due to no other # listed in profile

## 2023-06-18 NOTE — Progress Notes (Signed)
Pt's name and DOB verified at the beginning of the pre-visit.  Pt denies any difficulty with ambulating,sitting, laying down or rolling side to side Gave both [facility] main # and MD on call # prior to instructions.  No egg or soy allergy known to patient  Patient denies ever being intubated Pt has no issues moving head neck or swallowing No FH of Malignant Hyperthermia Pt is not on diet pills Pt is not on home 02  Pt is not on blood thinners  Pt denies issues with constipation  Pt is not on dialysis Pt denise any abnormal heart rhythms  Pt denies any upcoming cardiac testing Pt encouraged to use to use Singlecare or Goodrx to reduce cost  Patient's chart reviewed by Mary Crosby CNRA prior to pre-visit and patient appropriate for the [facility].  Pre-visit completed and red dot placed by patient's name on their procedure day (on provider's schedule).  . Visit by phone Pt states weight is 200 lb Instructed pt why it is important to and  to call if they have any changes in health or new medications. Directed them to the # given and on instructions.   Pt states they will.  Instructions reviewed with pt and pt states understanding. Instructed to review again prior to procedure. Pt states they will.  Instructions sent by mail with coupon and by my chart

## 2023-06-25 ENCOUNTER — Ambulatory Visit: Payer: Federal, State, Local not specified - PPO | Admitting: Family Medicine

## 2023-06-26 ENCOUNTER — Encounter: Payer: Self-pay | Admitting: Family Medicine

## 2023-06-26 ENCOUNTER — Ambulatory Visit: Payer: Federal, State, Local not specified - PPO | Admitting: Family Medicine

## 2023-06-26 VITALS — BP 123/80 | HR 74 | Temp 99.1°F | Ht 67.0 in | Wt 202.0 lb

## 2023-06-26 DIAGNOSIS — Z20822 Contact with and (suspected) exposure to covid-19: Secondary | ICD-10-CM

## 2023-06-26 LAB — POC COVID19 BINAXNOW: SARS Coronavirus 2 Ag: POSITIVE — AB

## 2023-06-26 NOTE — Assessment & Plan Note (Addendum)
Rapid Covid positive in clinic today. This is day # 7 of symptoms. Her symptoms have improved, even though she continues to run a low grade fever and does not feel well. 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 She will wear a mask and reports that her job is fairly isolated from others. Vital signs are stable. Lungs are clear. Oxygen saturation 99%.

## 2023-06-26 NOTE — Patient Instructions (Signed)
 Positive for Covid.  Quarantine guidelines: Isolate at home until your symptoms are improving and you do not have a fever for 24 hours without using fever reducing medications. Continue to isolate if you are not improving and continue to run a fever.  Supportive treatment for your symptoms.  Acetaminophen or ibuprofen for body aches and fever according to package instructions. Do not exceed recommended doses.  OTC cough medicines, such as Robitussin or Delsym will help your cough. If you have high blood pressure, take Coricidin to avoid increases in your blood pressure.  Monitor your oxygen saturation at home, seek medical attention at the emergency department if it falls below 94% or you develop shortness of breath (difficulty catching your breath, difficulty completing a sentence without stopping to catch your breath, or needing to stop walking to catch your breath).  Increase fluids to avoid dehydration. If you are unable to keep fluids down, you may need to go to the emergency department for further treatment.  Diet as tolerated. Rest.

## 2023-06-26 NOTE — Progress Notes (Signed)
Established Patient Office Visit  Subjective   Patient ID: Mary Crosby, female    DOB: 1971/05/26  Age: 52 y.o. MRN: [id number]  Chief Complaint  Patient presents with   Cough    Symptoms started wesnday last week ,fatigue and muscle pain and fever and lost a taste, pt would like test for covid for clearance to work    HPI Presents today for an acute visit with complaint of fatigue, fever T-max 100.1, lost of taste, with Covid positive family members exposure.  Symptoms have been present  since last Wednesday (7 days)  Associated symptoms include: sinus pressure and congestion, headaches  Pertinent negatives: muscle aches have improved Pain severity: 0/10  Treatments tried include : Nyquil and Dayquil, motrin for aches and fever, benadryl for congestion.  Treatment effective : started to feel better Sick contacts : grandson and husband Employer is requiring a Covid test in order to return to work. She is ill appearing and still running low grade fever. Cough has improved.    Review of Systems  Constitutional:  Positive for fever and malaise/fatigue.  HENT:  Positive for congestion and sinus pain.   Respiratory:  Negative for cough and shortness of breath.       Objective:     BP 123/80   Pulse 74   Temp 99.1 F (37.3 C) (Oral)   Ht 5\' 7"  (1.702 m)   Wt 202 lb (91.6 kg)   SpO2 99%   BMI 31.64 kg/m  BP Readings from Last 3 Encounters:  06/26/23 123/80  04/18/23 111/82  04/12/23 111/79      Physical Exam Vitals and nursing note reviewed.  Constitutional:      Appearance: Normal appearance. She is ill-appearing.  HENT:     Right Ear: Tympanic membrane normal.     Left Ear: Tympanic membrane normal.     Mouth/Throat:     Mouth: Mucous membranes are moist.     Pharynx: Oropharynx is clear.  Cardiovascular:     Rate and Rhythm: Regular rhythm.     Heart sounds: Normal heart sounds.  Pulmonary:     Effort: Pulmonary effort is normal.     Breath  sounds: Normal breath sounds.  Skin:    General: Skin is warm and dry.  Neurological:     General: No focal deficit present.     Mental Status: She is alert. Mental status is at baseline.  Psychiatric:        Mood and Affect: Mood normal.        Behavior: Behavior normal.        Thought Content: Thought content normal.        Judgment: Judgment normal.     Results for orders placed or performed in visit on 06/26/23  POC COVID-19 BinaxNow  Result Value Ref Range   SARS Coronavirus 2 Ag Positive (A) Negative      The ASCVD Risk score (Arnett DK, et al., 2019) failed to calculate for the following reasons:   Cannot find a previous HDL lab   Cannot find a previous total cholesterol lab    Assessment & Plan:   Problem List Items Addressed This Visit     Close exposure to COVID-19 virus - Primary    Rapid Covid positive in clinic today. This is day # 7 of symptoms. Her symptoms have improved, even though she continues to run a low grade fever and does not feel well. 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 She will wear a mask and reports that her job is fairly isolated from others. Vital signs are stable. Lungs are clear. Oxygen saturation 99%.       Relevant Orders   POC COVID-19 BinaxNow (Completed)  Agrees with plan of care discussed.  Questions answered.   Return if symptoms worsen or fail to improve.    Novella Olive, FNP

## 2023-07-08 ENCOUNTER — Telehealth: Payer: Self-pay | Admitting: Internal Medicine

## 2023-07-08 ENCOUNTER — Encounter: Payer: Federal, State, Local not specified - PPO | Admitting: Internal Medicine

## 2023-07-08 NOTE — Telephone Encounter (Signed)
Dear Dr. Leonides Schanz,   I call this patient and did not get anyone to answer. I left a message for her to call and reschedule.  This patient answered "NO"  to the automated phone reminder.

## 2023-07-18 DIAGNOSIS — E0500 Thyrotoxicosis with diffuse goiter without thyrotoxic crisis or storm: Secondary | ICD-10-CM | POA: Diagnosis not present

## 2023-08-30 ENCOUNTER — Ambulatory Visit (INDEPENDENT_AMBULATORY_CARE_PROVIDER_SITE_OTHER): Payer: Federal, State, Local not specified - PPO | Admitting: Family Medicine

## 2023-08-30 ENCOUNTER — Encounter: Payer: Self-pay | Admitting: Family Medicine

## 2023-08-30 VITALS — BP 102/55 | HR 84 | Temp 98.6°F | Resp 18 | Ht 67.0 in | Wt 203.9 lb

## 2023-08-30 DIAGNOSIS — Z566 Other physical and mental strain related to work: Secondary | ICD-10-CM | POA: Insufficient documentation

## 2023-08-30 MED ORDER — BUPROPION HCL ER (XL) 150 MG PO TB24
150.0000 mg | ORAL_TABLET | Freq: Every day | ORAL | 0 refills | Status: DC
Start: 2023-08-30 — End: 2023-09-20

## 2023-08-30 NOTE — Progress Notes (Signed)
Established Patient Office Visit  Subjective   Patient ID: Mary Crosby, female    DOB: 09/02/1971  Age: 52 y.o. MRN: [id number]  Chief Complaint  Patient presents with   Panic Attack    Patient states that she had what she considers a Panic attack on Thursday while at work, patient states that it was stress induced, and that she has never experienced this before    HPI  Situational stress:  Presents for symptoms of panic attack, was taking Wellbutrin to help with smoking cessation and is not taking currently.   Current symptoms occurred after experiencing a threatening situation while at work.  Symptoms on Thursday include: starting having chest pain- needed to step outside to get air. Symptoms resolved once removed from situation.  She is concerned with her safety at work, spoke with management when this  happened.  She is still concerned about this situation. On Thursday before the "event" she was given information that did not seem she was going to get resolution. Works for [employer] for 18  years and feels like she is not getting resolution. She finally has someone that is helping her with this situation.   Has not had chest pain or "panic attack "since being removed from the stressful situation at work.  She is tearful in the office.  Needs work note while situation is being resolved.  She will likely need FMLA paperwork completed in the near future.  Chart review:  9/19: endocrine visit TSH plus free T4 checked at that visit.  Patient reports she was told to stop the methimazole.  Reports her thyroid function is stable. TSH normal, free T4 normal.  Wants to see counselor and restart Wellbutrin XL 150 mg daily.  Review of Systems  Psychiatric/Behavioral:  Positive for depression. Negative for suicidal ideas. The patient is nervous/anxious.       Objective:     BP (!) 102/55   Pulse 84   Temp 98.6 F (37 C) (Oral)   Resp 18   Ht 5\' 7"  (1.702 m)   Wt 203 lb 14.4 oz  (92.5 kg)   SpO2 98%   BMI 31.94 kg/m  BP Readings from Last 3 Encounters:  08/30/23 (!) 102/55  06/26/23 123/80  04/18/23 111/82      Physical Exam Vitals and nursing note reviewed.  Constitutional:      General: She is not in acute distress.    Appearance: Normal appearance.  Pulmonary:     Effort: Pulmonary effort is normal.  Skin:    General: Skin is warm and dry.  Neurological:     General: No focal deficit present.     Mental Status: She is alert. Mental status is at baseline.  Psychiatric:        Mood and Affect: Mood normal.        Behavior: Behavior normal.        Thought Content: Thought content normal.        Judgment: Judgment normal.     No results found for any visits on 08/30/23.    The ASCVD Risk score (Arnett DK, et al., 2019) failed to calculate for the following reasons:   Cannot find a previous HDL lab   Cannot find a previous total cholesterol lab    Assessment & Plan:   Problem List Items Addressed This Visit     Stress at work - Primary    Stressful situation at work where she felt threatened by a Radio broadcast assistant.  Shortly  after this situation she experienced chest pain and felt like she was having a "panic attack.  Once she was removed from the situation her symptoms have resolved.  She has been working for the [employer] for 18 years and feels like that she is not getting the adequate support that she needs.  She was taking Wellbutrin XL 150 mg for smoking cessation, she would like to restart this for depression, anxiety.  Ambulatory referral  for counseling placed today.  Work note provided, she may return to work on September 07, 2023.  Likely will need FMLA paperwork completed while the situation is being addressed.      Relevant Medications   buPROPion (WELLBUTRIN XL) 150 MG 24 hr tablet   Other Relevant Orders   Ambulatory referral to Psychology  Agrees with plan of care discussed.  Questions answered.   Return in about 3 weeks (around  09/20/2023) for medication management for depression/anxiety .    Novella Olive, FNP

## 2023-08-30 NOTE — Patient Instructions (Signed)
[facility] [address]  [place], [place]  No appointment necessary  Open 24/7.   

## 2023-08-30 NOTE — Assessment & Plan Note (Signed)
Stressful situation at work where she felt threatened by a Radio broadcast assistant.  Shortly after this situation she experienced chest pain and felt like she was having a "panic attack.  Once she was removed from the situation her symptoms have resolved.  She has been working for the post office for 18 years and feels like that she is not getting the adequate support that she needs.  She was taking Wellbutrin XL 150 mg for smoking cessation, she would like to restart this for depression, anxiety.  Ambulatory referral  for counseling placed today.  Work note provided, she may return to work on September 07, 2023.  Likely will need FMLA paperwork completed while the situation is being addressed.

## 2023-09-09 ENCOUNTER — Telehealth: Payer: Self-pay | Admitting: Family Medicine

## 2023-09-09 NOTE — Telephone Encounter (Signed)
Letter for work completed and placed in completed forms box.  Ready for pick up by patient.

## 2023-09-09 NOTE — Telephone Encounter (Signed)
Patient in office today to request an extension on time out of work until a new date of 11/22, when follow-up is scheduled. Please advise. Katha Hamming

## 2023-09-11 ENCOUNTER — Encounter: Payer: Self-pay | Admitting: Family Medicine

## 2023-09-11 ENCOUNTER — Ambulatory Visit (INDEPENDENT_AMBULATORY_CARE_PROVIDER_SITE_OTHER): Payer: Federal, State, Local not specified - PPO | Admitting: Family Medicine

## 2023-09-11 VITALS — BP 125/76 | HR 68 | Temp 98.1°F | Resp 18 | Ht 67.0 in | Wt 211.0 lb

## 2023-09-11 DIAGNOSIS — F411 Generalized anxiety disorder: Secondary | ICD-10-CM | POA: Diagnosis not present

## 2023-09-11 DIAGNOSIS — F321 Major depressive disorder, single episode, moderate: Secondary | ICD-10-CM | POA: Diagnosis not present

## 2023-09-11 DIAGNOSIS — Z0289 Encounter for other administrative examinations: Secondary | ICD-10-CM

## 2023-09-11 NOTE — Patient Instructions (Signed)
09/25/2023 2:00 PM Mary Crosby, [facility]

## 2023-09-11 NOTE — Assessment & Plan Note (Signed)
FMLA forms completed for [employer].

## 2023-09-11 NOTE — Progress Notes (Signed)
   Established Patient Office Visit  Subjective   Patient ID: Mary Crosby, female    DOB: 22-Nov-1970  Age: 52 y.o. MRN: [id number]  Chief Complaint  Patient presents with   Paperwork     HPI  Presents today for FMLA completion and to discuss mental status.  Things are getting worse with depression and anxiety. Needed to stay in job due to financial obligations. 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 Not getting paid for time away. Will need to return to work before she is ready due to financial needs.  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  Only gets 5 sesions with EAP. Psychiatry appointment on 11/27 with counselor,  this is not soon enough. Information provided for counselor in [place].  She will call to make an appointment today if possible.  Taking Wellbutrin XL 150 mg daily since 11/1, Has not seen improvement in symptoms as of yet. Follow-up on 11/22 with PCP.  Denies thoughts of self harm.  Feels increased anxiety about thoughts of returning to work.     Review of Systems  Psychiatric/Behavioral:  Positive for depression. Negative for suicidal ideas. The patient is nervous/anxious.       Objective:     BP 125/76   Pulse 68   Temp 98.1 F (36.7 C) (Oral)   Resp 18   Ht 5\' 7"  (1.702 m)   Wt 211 lb (95.7 kg)   SpO2 99%   BMI 33.05 kg/m  BP Readings from Last 3 Encounters:  09/11/23 125/76  08/30/23 (!) 102/55  06/26/23 123/80      Physical Exam Vitals and nursing note reviewed.  Constitutional:      General: She is not in acute distress.    Appearance: Normal appearance.  Cardiovascular:     Rate and Rhythm: Normal rate.  Pulmonary:     Effort: Pulmonary effort is normal.  Skin:    General: Skin is warm and dry.  Neurological:     General: No focal deficit present.     Mental Status: She is alert. Mental status is at baseline.  Psychiatric:        Mood and Affect: Mood normal.        Behavior: Behavior normal.         Thought Content: Thought content normal.        Judgment: Judgment normal.     No results found for any visits on 09/11/23.    The ASCVD Risk score (Arnett DK, et al., 2019) failed to calculate for the following reasons:   Cannot find a previous HDL lab   Cannot find a previous total cholesterol lab    Assessment & Plan:   Problem List Items Addressed This Visit     Encounter for completion of form with patient    FMLA forms completed for [employer].       Depression, major, single episode, moderate (HCC) - Primary   GAD (generalized anxiety disorder)    Return for has appointment 11/22 for medication management.   Total time face to face completing FMLA papers, discussing other options for counseling, discussing future plans and current situation at work: 30 minutes.    Novella Olive, FNP

## 2023-09-20 ENCOUNTER — Ambulatory Visit (INDEPENDENT_AMBULATORY_CARE_PROVIDER_SITE_OTHER): Payer: Federal, State, Local not specified - PPO | Admitting: Family Medicine

## 2023-09-20 ENCOUNTER — Encounter: Payer: Self-pay | Admitting: Family Medicine

## 2023-09-20 VITALS — BP 116/78 | HR 92 | Temp 98.5°F | Resp 20 | Ht 67.0 in | Wt 208.7 lb

## 2023-09-20 DIAGNOSIS — F321 Major depressive disorder, single episode, moderate: Secondary | ICD-10-CM

## 2023-09-20 DIAGNOSIS — Z566 Other physical and mental strain related to work: Secondary | ICD-10-CM

## 2023-09-20 MED ORDER — BUPROPION HCL ER (XL) 150 MG PO TB24: 150.0000 mg | ORAL_TABLET | Freq: Every day | ORAL | 0 refills | Status: AC

## 2023-09-20 NOTE — Assessment & Plan Note (Addendum)
Started Wellbutrin XL 150 mg on 11/1. Taking daily as prescribed. Symptoms are improving some. She is experiencing situational stress with her job. She has been out of work since 10/30. FMLA paper work has been submitted. PHQ-9 score indicates severe depression, no thoughts of self-harm. She will continue taking Wellbutrin XL 150 mg daily. Starts counseling on 11/27. Follow-up in 3 months, sooner if needed.

## 2023-09-20 NOTE — Progress Notes (Signed)
Established Patient Office Visit  Subjective   Patient ID: Mary Crosby, female    DOB: 1971-10-22  Age: 52 y.o. MRN: [id number]  Chief Complaint  Patient presents with   Medical Management of Chronic Issues    Patient is here for medication check    HPI  Going through stressful situation at work. Started on Wellbutrin XL 150 mg daily on 11/1. Symptoms are improving.  Sleeping a lot.  Has not returned to work due to excessive worry about current situation.  Wants to wait to see counselor, but she may need to go back before this visit.  Has appointment on 09/25/23 with Anselmo Pickler, Counselor.  Denies thoughts of self harm.  Needs workman's comp paperwork completed to see if she is under treatment for depression. Has not been to work since 08/28/23.     Review of Systems  Psychiatric/Behavioral:  Positive for depression. Negative for suicidal ideas. The patient is nervous/anxious.       Objective:     BP 116/78   Pulse 92   Temp 98.5 F (36.9 C) (Oral)   Resp 20   Ht 5\' 7"  (1.702 m)   Wt 208 lb 11.2 oz (94.7 kg)   SpO2 99%   BMI 32.69 kg/m  BP Readings from Last 3 Encounters:  09/20/23 116/78  09/11/23 125/76  08/30/23 (!) 102/55      Physical Exam Vitals and nursing note reviewed.  Constitutional:      General: She is not in acute distress.    Appearance: Normal appearance.  Cardiovascular:     Rate and Rhythm: Normal rate.  Pulmonary:     Effort: Pulmonary effort is normal.  Skin:    General: Skin is warm and dry.  Neurological:     General: No focal deficit present.     Mental Status: She is alert. Mental status is at baseline.  Psychiatric:        Mood and Affect: Mood normal.        Behavior: Behavior normal.        Thought Content: Thought content normal.        Judgment: Judgment normal.     Flowsheet Row Office Visit from 09/20/2023 in [facility] Total Score 19     No thoughts of  self-harm. Has appointment with counselor 09/25/23.   No results found for any visits on 09/20/23.    The ASCVD Risk score (Arnett DK, et al., 2019) failed to calculate for the following reasons:   Cannot find a previous HDL lab   Cannot find a previous total cholesterol lab    Assessment & Plan:   Problem List Items Addressed This Visit     Depression, major, single episode, moderate (HCC) - Primary    Started Wellbutrin XL 150 mg on 11/1. Taking daily as prescribed. Symptoms are improving some. She is experiencing situational stress with her job. She has been out of work since 10/30. FMLA paper work has been submitted. PHQ-9 score indicates severe depression, no thoughts of self-harm. She will continue taking Wellbutrin XL 150 mg daily. Starts counseling on 11/27. Follow-up in 3 months, sooner if needed.       Relevant Medications   buPROPion (WELLBUTRIN XL) 150 MG 24 hr tablet   Stress at work   Relevant Medications   buPROPion (WELLBUTRIN XL) 150 MG 24 hr tablet  Agrees with plan of care discussed.  Questions answered.   Return in about  3 months (around 12/21/2023) for depression med managment .    Novella Olive, FNP

## 2023-09-23 ENCOUNTER — Telehealth: Payer: Self-pay | Admitting: Family Medicine

## 2023-09-23 NOTE — Telephone Encounter (Signed)
Patient dropping off Worker's Comp paperwork for provider. Because patient requesting paperwork be completed online per [place] [employer] instruction, patient also provided signed medical release form. Paperwork placed in provider basket. Patient aware of turn around time and potential fees. Katha Hamming

## 2023-09-24 NOTE — Telephone Encounter (Signed)
Spoke with Drinda Butts via telephone. She will go to her psychiatry appointment tomorrow and let this provider know if documentation is needed from PCP. It may be required to get information from psychiatry.

## 2023-09-25 ENCOUNTER — Ambulatory Visit (INDEPENDENT_AMBULATORY_CARE_PROVIDER_SITE_OTHER): Payer: Federal, State, Local not specified - PPO | Admitting: Licensed Clinical Social Worker

## 2023-09-25 ENCOUNTER — Telehealth: Payer: Self-pay | Admitting: Family Medicine

## 2023-09-25 DIAGNOSIS — F419 Anxiety disorder, unspecified: Secondary | ICD-10-CM | POA: Diagnosis not present

## 2023-09-25 NOTE — Telephone Encounter (Signed)
Pt. Aware that she needs to call [facility] to schedule referral appointment. Sent mychart message to patient with contact information.

## 2023-09-25 NOTE — Telephone Encounter (Signed)
Has appointment at 2:00 today with behavioral health per patient report.

## 2023-09-25 NOTE — Telephone Encounter (Signed)
Pt came into the office requesting to check the status of behavioral health referral. Pt had an appointment with Therapist today at [facility] but needs to see Psychiatrist for testing and diagnosis.  Contacted [facility] ( spoke w/ Rocky Link), Multiple attempts have been made to contact patient and no response. Per Rocky Link, patient must call to make appointment per office policy. Pt can call [facility] [phone].

## 2023-09-25 NOTE — Progress Notes (Addendum)
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 and fear of returning to work environment  Mental Status Exam: Appearance:   Fairly Groomed     Behavior:  Horticulturist, commercial:  Chief Technology Officer:   Slow  Affect:  Tearful  Mood:  depressed and sad  Thought process:  flight of ideas  Thought content:    WNL  Sensory/Perceptual disturbances:    WNL  Orientation:  oriented to person, place, and time/date  Attention:  Fair  Concentration:  Good  Memory:  WNL  Fund of knowledge:   Good  Insight:    Good  Judgment:   Good  Impulse Control:  Good   Reported Symptoms:  Paranoid, stress, anxiety, loner and isolating to avoid conflict  Risk Assessment: Danger to Self:  No Self-injurious Behavior: No Danger to Others: No Duty to Warn:no Physical Aggression / Violence:No  Access to Firearms a concern: No  Gang Involvement:No  Patient / guardian was educated about steps to take if suicide or homicide risk level increases between visits: yes While future psychiatric events cannot be accurately predicted, the patient does not currently require acute inpatient psychiatric care and does not currently meet [facility] involuntary commitment criteria.  Substance Abuse History: Current substance abuse: No     Caffeine:Coffee Tobacco:Daily usage  Alcohol:Occasional Substance use:  Past Psychiatric History:   No previous psychological problems have been observed Outpatient Providers:N/A History of Psych Hospitalization: No  Psychological Testing:  N/A    Abuse History:  Victim of: No., emotional from current work incident Report needed: No. Victim of  Neglect:No. Perpetrator of  N/A   Witness / Exposure to Domestic Violence: No   Protective Services Involvement: No  Witness to [employer] Violence:  No   Family History:  Family History  Problem Relation Age of Onset   Diabetes Mother    Coronary artery disease Mother    Diabetes Father    Lung disease Father    Colon cancer Neg Hx    Esophageal cancer Neg Hx    Colon polyps Neg Hx    Rectal cancer Neg Hx    Stomach cancer Neg Hx     Living situation: the patient lives with their family  Sexual Orientation: Straight  Relationship Status: divorced  Name of spouse / other:N/A If a parent, number of children / ages:Boy 12   Support Systems: friends Sister  Surveyor, quantity Stress:  Yes   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 History of legal issue / charges:  None  Medical History/Surgical History: not reviewed Past Medical History:  Diagnosis Date   Anemia    Depression    Dry eye syndrome    GAD (generalized anxiety disorder)  Graves' disease    Hyperthyroidism     Past Surgical History:  Procedure Laterality Date   CESAREAN SECTION     TUBAL LIGATION     VAGINAL DELIVERY     x 2    Medications: Current Outpatient Medications  Medication Sig Dispense Refill   buPROPion (WELLBUTRIN XL) 150 MG 24 hr tablet Take 1 tablet (150 mg total) by mouth daily. 90 tablet 0   No current facility-administered medications for this visit.    Allergies   Allergen Reactions   Penicillins Nausea And Vomiting and Rash    Did it involve swelling of the face/tongue/throat, SOB, or low BP? Yes & her Dad's skin peels with this Did it involve sudden or severe rash/hives, skin peeling, or any reaction on the inside of your mouth or nose? Unk Did you need to seek medical attention at a hospital or doctor's office? No When did it last happen?Not recently  If all above answers are "NO", may proceed with cephalosporin use.     Diagnoses:  Anxiety  Psychiatric Treatment: No ,   Plan of Care: Outpatient Therapy  Narrative:  Mary Crosby participated from office with therapist and consented to treatment. We reviewed the limits of confidentiality prior to the start of the evaluation. Mary Crosby expressed understanding and agreement to proceed. Patient was tearful from the start of the session.  They shared feeling overwhelmed and fearful about not wanting to be labeled "crazy" because they were seeing a therapist.  Reported feeling paranoid and had a panic attack which felt like a heart attack due to the work stress and not wanting to return to the work environment because of the threats made by Radio broadcast assistant.      A follow-up was scheduled to create a treatment plan and begin treatment. Therapist answered  and all questions during the evaluation and contact information was provided. Patient wanted to have workers compensation paperwork completed but it required MD to complete.     Anselmo Pickler, [facility]

## 2023-10-03 ENCOUNTER — Telehealth: Payer: Self-pay | Admitting: Family Medicine

## 2023-10-03 ENCOUNTER — Ambulatory Visit: Payer: Federal, State, Local not specified - PPO | Admitting: Licensed Clinical Social Worker

## 2023-10-03 NOTE — Telephone Encounter (Signed)
Patient coming in today regarding Worker's Comp paperwork.  Retrieved paperwork from provider.  Using paperwork as reference, went through and located all the things that the patient needs sent to [employer], per their list. Requested information should be uploaded to [employer] available on paperwork. Patient signed release for all information pertinent to issue to be released to this website. Pertinent documentation printed.  Paperwork states that paperwork MUST be submitted by a qualified physician or must be countersigned. Placed a call to worker's comp to clarify on this. Paperwork placed in provider's box temporarily to maintain knowledge of location.  Katha Hamming

## 2023-10-03 NOTE — Telephone Encounter (Signed)
Okay to send once clarification is made on co-signature required. Workers comp has been notified to verify if NP is allowed to submit paperwork.

## 2023-10-04 NOTE — Telephone Encounter (Signed)
Hope with Worker's Comp returning call to confirm that information should be signed by supervising physician and PCP prior to submission. Anyone in office may upload paperwork so long as signatures are present. Katha Hamming  Paperwork pending signatures.

## 2023-10-04 NOTE — Telephone Encounter (Signed)
Provider will have supervising physician sign paperwork on Monday once she returns to office.

## 2023-10-07 NOTE — Telephone Encounter (Signed)
Received completed, signed paperwork at front desk. Per instruction, scanned and uploaded forms to Department of Labor website with patient permission. Katha Hamming

## 2023-10-16 DIAGNOSIS — F331 Major depressive disorder, recurrent, moderate: Secondary | ICD-10-CM | POA: Diagnosis not present

## 2023-10-16 DIAGNOSIS — F4312 Post-traumatic stress disorder, chronic: Secondary | ICD-10-CM | POA: Diagnosis not present

## 2023-10-16 DIAGNOSIS — F411 Generalized anxiety disorder: Secondary | ICD-10-CM | POA: Diagnosis not present

## 2023-10-28 NOTE — Telephone Encounter (Signed)
Erroneous encounter

## 2023-11-06 DIAGNOSIS — Z0289 Encounter for other administrative examinations: Secondary | ICD-10-CM

## 2023-11-07 ENCOUNTER — Ambulatory Visit (INDEPENDENT_AMBULATORY_CARE_PROVIDER_SITE_OTHER): Payer: BC Managed Care – PPO | Admitting: Family Medicine

## 2023-11-07 ENCOUNTER — Encounter: Payer: Self-pay | Admitting: Family Medicine

## 2023-11-07 VITALS — BP 116/83 | HR 83 | Temp 98.0°F | Ht 66.5 in | Wt 208.0 lb

## 2023-11-07 DIAGNOSIS — E66811 Obesity, class 1: Secondary | ICD-10-CM

## 2023-11-07 DIAGNOSIS — F321 Major depressive disorder, single episode, moderate: Secondary | ICD-10-CM

## 2023-11-07 DIAGNOSIS — Z6833 Body mass index (BMI) 33.0-33.9, adult: Secondary | ICD-10-CM | POA: Diagnosis not present

## 2023-11-07 DIAGNOSIS — F17210 Nicotine dependence, cigarettes, uncomplicated: Secondary | ICD-10-CM

## 2023-11-07 DIAGNOSIS — E6609 Other obesity due to excess calories: Secondary | ICD-10-CM

## 2023-11-07 NOTE — Assessment & Plan Note (Signed)
 She reports wanting to quit smoking but being fearful about weight gain She reports no help from the addition of Wellbutrin in helping her quit  We discussed a plan to working smoking cessation once she is actively losing weight > 1 month

## 2023-11-07 NOTE — Progress Notes (Signed)
 Office: [phone]  /  Fax: [phone]   Initial Visit  Mary Crosby was seen in clinic today to evaluate for obesity. She is interested in losing weight to improve overall health and reduce the risk of weight related complications. She presents today to review program treatment options, initial physical assessment, and evaluation.     She was referred by: PCP  When asked what else they would like to accomplish? She states: Improve existing medical conditions, Improve quality of life, and Improve appearance  would like to get ~160 lb and she would like to quit smoking without gaining weight  Weight history: gained weight in the past 6 years, up 38 lb.  She has gone through menopause and was diagnosed with Graves Dz.  She is less active outside of work.  Stress levels are high  When asked how has your weight affected you? She states: Contributed to medical problems, Contributed to orthopedic problems or mobility issues, and Having fatigue  Some associated conditions: None  Contributing factors: Disruption of circadian rhythm / sleep disordered breathing and she is a picky eater  Weight promoting medications identified: None  Current nutrition plan: None  Current level of physical activity: NEAT  Current or previous pharmacotherapy: Buproprion  Response to medication: Never tried medications   Past medical history includes:   Past Medical History:  Diagnosis Date   Anemia    Depression    Dry eye syndrome    GAD (generalized anxiety disorder)    Graves' disease    Hyperthyroidism      Objective:   BP 116/83   Pulse 83   Temp 98 F (36.7 C)   Ht 5' 6.5 (1.689 m)   Wt 208 lb (94.3 kg)   SpO2 97%   BMI 33.07 kg/m  She was weighed on the bioimpedance scale: Body mass index is 33.07 kg/m.  Peak Weight:208 , Body Fat%:41.5, Visceral Fat Rating:10, Weight trend over the last 12 months: Increasing  General:  Alert, oriented and cooperative. Patient is in  no acute distress.  Respiratory: Normal respiratory effort, no problems with respiration noted   Gait: able to ambulate independently  Mental Status: Normal mood and affect. Normal behavior. Normal judgment and thought content.   DIAGNOSTIC DATA REVIEWED:  BMET    Component Value Date/Time   NA 139 12/04/2018 2220   K 4.3 12/04/2018 2220   CL 106 12/04/2018 2220   CO2 24 12/04/2018 2220   GLUCOSE 118 (H) 12/04/2018 2220   BUN 10 12/04/2018 2220   CREATININE 1.08 (H) 12/04/2018 2220   CALCIUM  9.4 12/04/2018 2220   GFRNONAA >60 12/04/2018 2220   GFRAA >60 12/04/2018 2220   No results found for: HGBA1C No results found for: INSULIN  CBC    Component Value Date/Time   WBC 6.5 12/04/2018 2220   RBC 4.17 12/04/2018 2220   HGB 12.3 12/04/2018 2220   HCT 38.3 12/04/2018 2220   PLT 404 (H) 12/04/2018 2220   MCV 91.8 12/04/2018 2220   MCH 29.5 12/04/2018 2220   MCHC 32.1 12/04/2018 2220   RDW 13.9 12/04/2018 2220   Iron/TIBC/Ferritin/ %Sat No results found for: IRON, TIBC, FERRITIN, IRONPCTSAT Lipid Panel  No results found for: CHOL, TRIG, HDL, CHOLHDL, VLDL, LDLCALC, LDLDIRECT Hepatic Function Panel  No results found for: PROT, ALBUMIN, AST, ALT, ALKPHOS, BILITOT, BILIDIR, IBILI No results found for: TSH   Assessment and Plan:   Depression, major, single episode, moderate (HCC) Assessment & Plan: She reports an improved mood on  Wellbutrin  XL 150 mg daily She reports a good support system She is in counseling  Continue current plan of care We discussed the importance of addressing mood when actively working on lifestyle changes   Class 1 obesity due to excess calories with body mass index (BMI) of 33.0 to 33.9 in adult, unspecified whether serious comorbidity present  Smoking greater than 30 pack years Assessment & Plan: She reports wanting to quit smoking but being fearful about weight gain She reports no help from the  addition of Wellbutrin  in helping her quit  We discussed a plan to working smoking cessation once she is actively losing weight > 1 month         Obesity Treatment / Action Plan:  Patient will work on garnering support from family and friends to begin weight loss journey. Will work on eliminating or reducing the presence of highly palatable, calorie dense foods in the home. Will complete provided nutritional and psychosocial assessment questionnaire before the next appointment. Will be scheduled for indirect calorimetry to determine resting energy expenditure in a fasting state.  This will allow us  to create a reduced calorie, high-protein meal plan to promote loss of fat mass while preserving muscle mass. Will think about ideas on how to incorporate physical activity into their daily routine. Counseled on the health benefits of losing 5%-15% of total body weight. Was counseled on nutritional approaches to weight loss and benefits of reducing processed foods and consuming plant-based foods and high quality protein as part of nutritional weight management. Was counseled on pharmacotherapy and role as an adjunct in weight management.   Obesity Education Performed Today:  She was weighed on the bioimpedance scale and results were discussed and documented in the synopsis.  We discussed obesity as a disease and the importance of a more detailed evaluation of all the factors contributing to the disease.  We discussed the importance of long term lifestyle changes which include nutrition, exercise and behavioral modifications as well as the importance of customizing this to her specific health and social needs.  We discussed the benefits of reaching a healthier weight to alleviate the symptoms of existing conditions and reduce the risks of the biomechanical, metabolic and psychological effects of obesity.  Mary Crosby appears to be in the action stage of change and states they are  ready to start intensive lifestyle modifications and behavioral modifications.  20 minutes was spent today on this visit including the above counseling, pre-visit chart review, and post-visit documentation.  Reviewed by clinician on day of visit: allergies, medications, problem list, medical history, surgical history, family history, social history, and previous encounter notes pertinent to obesity diagnosis.    Darice Haddock, D.O. DABFM, DABOM [facility] [phone] [place], [place] [address] [phone]

## 2023-11-07 NOTE — Assessment & Plan Note (Signed)
 She reports an improved mood on Wellbutrin XL 150 mg daily She reports a good support system She is in counseling  Continue current plan of care We discussed the importance of addressing mood when actively working on lifestyle changes

## 2023-11-11 DIAGNOSIS — F331 Major depressive disorder, recurrent, moderate: Secondary | ICD-10-CM | POA: Diagnosis not present

## 2023-11-11 DIAGNOSIS — F411 Generalized anxiety disorder: Secondary | ICD-10-CM | POA: Diagnosis not present

## 2023-11-11 DIAGNOSIS — F4312 Post-traumatic stress disorder, chronic: Secondary | ICD-10-CM | POA: Diagnosis not present

## 2023-11-12 DIAGNOSIS — E0500 Thyrotoxicosis with diffuse goiter without thyrotoxic crisis or storm: Secondary | ICD-10-CM | POA: Diagnosis not present

## 2023-11-20 ENCOUNTER — Ambulatory Visit (INDEPENDENT_AMBULATORY_CARE_PROVIDER_SITE_OTHER): Payer: Federal, State, Local not specified - PPO | Admitting: Family Medicine

## 2023-11-20 ENCOUNTER — Encounter: Payer: Self-pay | Admitting: Family Medicine

## 2023-11-20 VITALS — BP 120/80 | HR 94 | Temp 98.2°F | Ht 66.5 in | Wt 208.0 lb

## 2023-11-20 DIAGNOSIS — R5383 Other fatigue: Secondary | ICD-10-CM

## 2023-11-20 DIAGNOSIS — F419 Anxiety disorder, unspecified: Secondary | ICD-10-CM

## 2023-11-20 DIAGNOSIS — G4726 Circadian rhythm sleep disorder, shift work type: Secondary | ICD-10-CM | POA: Insufficient documentation

## 2023-11-20 DIAGNOSIS — F17210 Nicotine dependence, cigarettes, uncomplicated: Secondary | ICD-10-CM

## 2023-11-20 DIAGNOSIS — Z6833 Body mass index (BMI) 33.0-33.9, adult: Secondary | ICD-10-CM

## 2023-11-20 DIAGNOSIS — E0500 Thyrotoxicosis with diffuse goiter without thyrotoxic crisis or storm: Secondary | ICD-10-CM

## 2023-11-20 DIAGNOSIS — E66811 Obesity, class 1: Secondary | ICD-10-CM | POA: Diagnosis not present

## 2023-11-20 DIAGNOSIS — E6609 Other obesity due to excess calories: Secondary | ICD-10-CM | POA: Insufficient documentation

## 2023-11-20 DIAGNOSIS — R0602 Shortness of breath: Secondary | ICD-10-CM | POA: Diagnosis not present

## 2023-11-20 DIAGNOSIS — F17200 Nicotine dependence, unspecified, uncomplicated: Secondary | ICD-10-CM | POA: Insufficient documentation

## 2023-11-20 DIAGNOSIS — F32A Depression, unspecified: Secondary | ICD-10-CM | POA: Diagnosis not present

## 2023-11-20 NOTE — Assessment & Plan Note (Signed)
Managed by Dr. Morrison Old Last TSH reviewed in chart recently done at 1.68 on 11/12/2023 She is currently off thyroid medication  Continue endocrinology follow-up.  Thyroid function not due today

## 2023-11-20 NOTE — Progress Notes (Signed)
At a Glance:  Vitals Temp: 98.2 F (36.8 C) BP: 120/80 Pulse Rate: 94 SpO2: 98 %   Anthropometric Measurements Height: 5' 6.5" (1.689 m) Weight: 208 lb (94.3 kg) BMI (Calculated): 33.07 Starting Weight: 208lb Peak Weight: 208lb   Body Composition  Body Fat %: 41.5 % Fat Mass (lbs): 86.4 lbs Muscle Mass (lbs): 115.8 lbs Total Body Water (lbs): 76 lbs Visceral Fat Rating : 10   Other Clinical Data RMR: 2074 Fasting: No Labs: No Today's Visit #: 1 Starting Date: 11/20/23    EKG: Normal sinus rhythm, rate 93.  Indirect Calorimeter completed today shows a VO2 of 301 and a REE of 2074.  Her calculated basal metabolic rate is 1308 thus her basal metabolic rate is better than expected.  Chief Complaint:  Obesity   Subjective:  Mary Crosby (MR# [id number]) is a 53 y.o. female who presents for evaluation and treatment of obesity and related comorbidities.   Mary Crosby is currently in the action stage of change and ready to dedicate time achieving and maintaining a healthier weight. Mary Crosby is interested in becoming our patient and working on intensive lifestyle modifications including (but not limited to) diet and exercise for weight loss.  Mary Crosby has been struggling with her weight. She has been unsuccessful in either losing weight, maintaining weight loss, or reaching her healthy weight goal.  She is divorced and lives with her boyfriend who is supportive.  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  Back pain has been a limiting factor in exercise as well as low energy levels and lack of motivation.  Mary Crosby's habits were reviewed today and are as follows: her desired weight loss is 40 lb, she started gaining weight 6 years ago, up to 38 pounds, she has significant food cravings issues, she wakes up frequently in the middle of the night to eat, she skips meals frequently, she is trying to follow a vegetarian diet, she is frequently  drinking liquids with calories, she frequently makes poor food choices, she has problems with excessive hunger, she frequently eats larger portions than normal, and she struggles with emotional eating.    Other Fatigue Mary Crosby admits to daytime somnolence and admits to waking up still tired. Patient has a history of symptoms of daytime fatigue. Arlita generally gets 6 hours of sleep per night, and states that she has nightime awakenings. Snoring is present. Apneic episodes are not present. Epworth Sleepiness Score is 16.   Shortness of Breath Mary Crosby notes increasing shortness of breath with exercising and seems to be worsening over time with weight gain. She notes getting out of breath sooner with activity than she used to. This has gotten worse recently. Mary Crosby denies shortness of breath at rest or orthopnea.   Depression Screen Mary Crosby's Food and Mood (modified PHQ-9) score was 24.     09/20/2023    9:56 AM  Depression screen PHQ 2/9  Decreased Interest 3  Down, Depressed, Hopeless 3  PHQ - 2 Score 6  Altered sleeping 3  Tired, decreased energy 3  Change in appetite 3  Feeling bad or failure about yourself  0  Trouble concentrating 2  Moving slowly or fidgety/restless 2  Suicidal thoughts 0  PHQ-9 Score 19  Difficult doing work/chores Somewhat difficult     Assessment and Plan:   Other Fatigue Mary Crosby does feel that her weight is causing her energy to be lower than it should be. Fatigue may be related to obesity, depression or many  other causes. Labs will be ordered, and in the meanwhile, Mary Crosby will focus on self care including making healthy food choices, increasing physical activity and focusing on stress reduction.  Shortness of Breath Mary Crosby does feel that she gets out of breath more easily that she used to when she exercises. Mary Crosby's shortness of breath appears to be obesity related and exercise induced. She has agreed to work on weight loss and gradually  increase exercise to treat her exercise induced shortness of breath. Will continue to monitor closely.  Mary Crosby had a positive depression screening. Depression is commonly associated with obesity and often results in emotional eating behaviors. We will monitor this closely and work on CBT to help improve the non-hunger eating patterns. Referral to Psychology may be required if no improvement is seen as she continues in our clinic.    Problem List Items Addressed This Visit     Graves disease   Managed by Dr. Morrison Old Last TSH reviewed in chart recently done at 1.68 on 11/12/2023 She is currently off thyroid medication  Continue endocrinology follow-up.  Thyroid function not due today      Current every day smoker   She is an every day smoker.  She would like to quit smoking in the future but is afraid of weight gain.  We discussed an active plan to begin working on weight reduction and once she is actively losing weight, beginning a plan for smoking cessation.      Class 1 obesity due to excess calories with serious comorbidity and body mass index (BMI) of 33.0 to 33.9 in adult   Anxiety and depression   She reports good compliance taking fluoxetine 10 mg once daily and bupropion XL 150 mg once daily.  Her PHQ-9 was quite high at 24.  She reports a good support system at home but has stress at work.  She has been consuming more liquor lately and doing some emotional eating.  We discussed the importance of self-care, improving sleep, good nutrition and regular exercise.  Will be working on reducing intake of alcohol, ultra processed foods while adding in more lean protein and fiber.  Work on Designer, fashion/clothing well with water.  Plan to add in more regular exercise to help with mood.  Consider the addition of behavioral health counseling with Dr. Dewaine Conger.      Relevant Medications   FLUoxetine (PROZAC) 10 MG capsule   Shift work sleep disorder   Working night shift with broken sleep patterns is  likely a cause of her fatigue and difficulty with weight loss over the past 6 years.  Explained the importance of getting adequate deep sleep.  She may move her 3 meals to adjust for her work schedule.  Recommend consolidating sleep time from 9 AM to 3:30 PM with less interruption.  Minimize snacking during sleep time.      Other Visit Diagnoses       SOBOE (shortness of breath on exertion)    -  Primary     Other fatigue       Relevant Orders   EKG 12-Lead   VITAMIN D 25 Hydroxy (Vit-D Deficiency, Fractures)   Comprehensive metabolic panel   Lipid panel   Vitamin B12   CBC   Hemoglobin A1c   Insulin, random   Folate   Ferritin   Iron and TIBC       Mary Crosby is currently in the action stage of change and her goal is to continue with weight loss efforts. I  recommend Anita begin the structured treatment plan as follows:  She has agreed to keeping a food journal and adhering to recommended goals of 1600 calories and 100 g of  protein and Vegetarian Plan  Exercise goals: All adults should avoid inactivity. Some activity is better than none, and adults who participate in any amount of physical activity, gain some health benefits.  Behavioral modification strategies:increasing lean protein intake, increasing vegetables, increase H2O intake, decrease liquid calories, decrease ETOH, increase high fiber foods, decreasing eating out, meal planning and cooking strategies, keeping healthy foods in the home, better snacking choices, emotional eating strategies , avoiding temptations, and planning for success  She was informed of the importance of frequent follow-up visits to maximize her success with intensive lifestyle modifications for her multiple health conditions. She was informed we would discuss her lab results at her next visit unless there is a critical issue that needs to be addressed sooner. Darean agreed to keep her next visit at the agreed upon time to discuss these  results.  Objective:  General: Cooperative, alert, well developed, in no acute distress. HEENT: Conjunctivae and lids unremarkable. Cardiovascular: Regular rhythm.  Lungs: Normal work of breathing. Neurologic: No focal deficits.   Lab Results  Component Value Date   CREATININE 1.08 (H) 12/04/2018   BUN 10 12/04/2018   NA 139 12/04/2018   K 4.3 12/04/2018   CL 106 12/04/2018   CO2 24 12/04/2018   No results found for: "ALT", "AST", "GGT", "ALKPHOS", "BILITOT" No results found for: "HGBA1C" No results found for: "INSULIN" No results found for: "TSH" No results found for: "CHOL", "HDL", "LDLCALC", "LDLDIRECT", "TRIG", "CHOLHDL" Lab Results  Component Value Date   WBC 6.5 12/04/2018   HGB 12.3 12/04/2018   HCT 38.3 12/04/2018   MCV 91.8 12/04/2018   PLT 404 (H) 12/04/2018   No results found for: "IRON", "TIBC", "FERRITIN"  Attestation Statements:  Reviewed by clinician on day of visit: allergies, medications, problem list, medical history, surgical history, family history, social history, and previous encounter notes.  Time spent on visit including pre-visit chart review and post-visit charting and care was 41 minutes.   Glennis Brink, DO

## 2023-11-20 NOTE — Assessment & Plan Note (Signed)
She is an every day smoker.  She would like to quit smoking in the future but is afraid of weight gain.  We discussed an active plan to begin working on weight reduction and once she is actively losing weight, beginning a plan for smoking cessation.

## 2023-11-20 NOTE — Assessment & Plan Note (Signed)
Working night shift with broken sleep patterns is likely a cause of her fatigue and difficulty with weight loss over the past 6 years.  Explained the importance of getting adequate deep sleep.  She may move her 3 meals to adjust for her work schedule.  Recommend consolidating sleep time from 9 AM to 3:30 PM with less interruption.  Minimize snacking during sleep time.

## 2023-11-20 NOTE — Assessment & Plan Note (Signed)
She reports good compliance taking fluoxetine 10 mg once daily and bupropion XL 150 mg once daily.  Her PHQ-9 was quite high at 24.  She reports a good support system at home but has stress at work.  She has been consuming more liquor lately and doing some emotional eating.  We discussed the importance of self-care, improving sleep, good nutrition and regular exercise.  Will be working on reducing intake of alcohol, ultra processed foods while adding in more lean protein and fiber.  Work on Designer, fashion/clothing well with water.  Plan to add in more regular exercise to help with mood.  Consider the addition of behavioral health counseling with Dr. Dewaine Conger.

## 2023-11-21 LAB — IRON AND TIBC
Iron Saturation: 32 % (ref 15–55)
Iron: 98 ug/dL (ref 27–159)
Total Iron Binding Capacity: 302 ug/dL (ref 250–450)
UIBC: 204 ug/dL (ref 131–425)

## 2023-11-21 LAB — COMPREHENSIVE METABOLIC PANEL
ALT: 17 [IU]/L (ref 0–32)
AST: 18 [IU]/L (ref 0–40)
Albumin: 4.4 g/dL (ref 3.8–4.9)
Alkaline Phosphatase: 93 [IU]/L (ref 44–121)
BUN/Creatinine Ratio: 10 (ref 9–23)
BUN: 8 mg/dL (ref 6–24)
Bilirubin Total: 0.4 mg/dL (ref 0.0–1.2)
CO2: 24 mmol/L (ref 20–29)
Calcium: 10 mg/dL (ref 8.7–10.2)
Chloride: 100 mmol/L (ref 96–106)
Creatinine, Ser: 0.82 mg/dL (ref 0.57–1.00)
Globulin, Total: 3.4 g/dL (ref 1.5–4.5)
Glucose: 84 mg/dL (ref 70–99)
Potassium: 4.4 mmol/L (ref 3.5–5.2)
Sodium: 139 mmol/L (ref 134–144)
Total Protein: 7.8 g/dL (ref 6.0–8.5)
eGFR: 87 mL/min/{1.73_m2} (ref >59)

## 2023-11-21 LAB — LIPID PANEL
Chol/HDL Ratio: 5 {ratio} — ABNORMAL HIGH (ref 0.0–4.4)
Cholesterol, Total: 217 mg/dL — ABNORMAL HIGH (ref 100–199)
HDL: 43 mg/dL (ref >39)
LDL Chol Calc (NIH): 148 mg/dL — ABNORMAL HIGH (ref 0–99)
Triglycerides: 144 mg/dL (ref 0–149)
VLDL Cholesterol Cal: 26 mg/dL (ref 5–40)

## 2023-11-21 LAB — CBC
Hematocrit: 46.1 % (ref 34.0–46.6)
Hemoglobin: 15.5 g/dL (ref 11.1–15.9)
MCH: 32.2 pg (ref 26.6–33.0)
MCHC: 33.6 g/dL (ref 31.5–35.7)
MCV: 96 fL (ref 79–97)
Platelets: 391 10*3/uL (ref 150–450)
RBC: 4.82 x10E6/uL (ref 3.77–5.28)
RDW: 12.2 % (ref 11.7–15.4)
WBC: 6.8 10*3/uL (ref 3.4–10.8)

## 2023-11-21 LAB — INSULIN, RANDOM: INSULIN: 8.1 u[IU]/mL (ref 2.6–24.9)

## 2023-11-21 LAB — VITAMIN B12: Vitamin B-12: 263 pg/mL (ref 232–1245)

## 2023-11-21 LAB — VITAMIN D 25 HYDROXY (VIT D DEFICIENCY, FRACTURES): Vit D, 25-Hydroxy: 10.9 ng/mL — ABNORMAL LOW (ref 30.0–100.0)

## 2023-11-21 LAB — HEMOGLOBIN A1C
Est. average glucose Bld gHb Est-mCnc: 131 mg/dL
Hgb A1c MFr Bld: 6.2 % — ABNORMAL HIGH (ref 4.8–5.6)

## 2023-11-21 LAB — FOLATE: Folate: 7.7 ng/mL (ref >3.0)

## 2023-11-21 LAB — FERRITIN: Ferritin: 118 ng/mL (ref 15–150)

## 2023-12-09 ENCOUNTER — Encounter: Payer: Self-pay | Admitting: Family Medicine

## 2023-12-09 ENCOUNTER — Ambulatory Visit (INDEPENDENT_AMBULATORY_CARE_PROVIDER_SITE_OTHER): Payer: Federal, State, Local not specified - PPO | Admitting: Family Medicine

## 2023-12-09 VITALS — BP 132/88 | HR 100 | Temp 98.0°F | Ht 66.5 in | Wt 210.0 lb

## 2023-12-09 DIAGNOSIS — E782 Mixed hyperlipidemia: Secondary | ICD-10-CM | POA: Diagnosis not present

## 2023-12-09 DIAGNOSIS — Z6833 Body mass index (BMI) 33.0-33.9, adult: Secondary | ICD-10-CM

## 2023-12-09 DIAGNOSIS — G4726 Circadian rhythm sleep disorder, shift work type: Secondary | ICD-10-CM | POA: Diagnosis not present

## 2023-12-09 DIAGNOSIS — E559 Vitamin D deficiency, unspecified: Secondary | ICD-10-CM | POA: Diagnosis not present

## 2023-12-09 DIAGNOSIS — E66811 Obesity, class 1: Secondary | ICD-10-CM

## 2023-12-09 DIAGNOSIS — R7303 Prediabetes: Secondary | ICD-10-CM

## 2023-12-09 DIAGNOSIS — F17200 Nicotine dependence, unspecified, uncomplicated: Secondary | ICD-10-CM

## 2023-12-09 DIAGNOSIS — F32A Depression, unspecified: Secondary | ICD-10-CM

## 2023-12-09 DIAGNOSIS — F419 Anxiety disorder, unspecified: Secondary | ICD-10-CM

## 2023-12-09 DIAGNOSIS — E6609 Other obesity due to excess calories: Secondary | ICD-10-CM

## 2023-12-09 DIAGNOSIS — F17210 Nicotine dependence, cigarettes, uncomplicated: Secondary | ICD-10-CM

## 2023-12-09 MED ORDER — VITAMIN D (ERGOCALCIFEROL) 1.25 MG (50000 UNIT) PO CAPS: 50000.0000 [IU] | ORAL_CAPSULE | ORAL | 0 refills | Status: AC

## 2023-12-09 MED ORDER — METFORMIN HCL ER 500 MG PO TB24: 500.0000 mg | ORAL_TABLET | Freq: Every day | ORAL | 0 refills | Status: DC

## 2023-12-09 MED ORDER — ROSUVASTATIN CALCIUM 5 MG PO TABS: 5.0000 mg | ORAL_TABLET | Freq: Every day | ORAL | 3 refills | Status: AC

## 2023-12-09 NOTE — Patient Instructions (Addendum)
 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  OK to have 1/4 plate starch with dinner  Replace fruit snacks with real fruit : )

## 2023-12-09 NOTE — Assessment & Plan Note (Signed)
 Lab Results  Component Value Date   HGBA1C 6.2 (H) 11/20/2023   Reviewed lab results with patient.  She is in the prediabetic range.  She has a positive family history for type 2 diabetes.  She had previously been over consuming starches and sweets.  She is working on dietary changes.  She has yet to add in regular exercise.  She has never used metformin .  Continue to work on prescribed meal plan. Begin metformin  XR 500 mg once daily with food

## 2023-12-09 NOTE — Assessment & Plan Note (Signed)
 She reports only taking Wellbutrin  XL 150 mg once daily per her PCP and has not been taking fluoxetine as she has some fear associated with this medication.  She has a good support system at home with her husband but has a lot of job-related stress.  She is looking at making changes to her job location to reduce her level of stress.  This has affected her energy levels, her motivation to make changes and has affected her food choices consuming more high sugar foods and comfort foods.  Continue to work on taking Wellbutrin  XL 150 mg once daily on a consistent basis while working on stress reduction.  Follow-up with PCP for further medication management.

## 2023-12-09 NOTE — Progress Notes (Signed)
 Office: [phone]  /  Fax: [phone]  WEIGHT SUMMARY AND BIOMETRICS  Starting Date: 11/20/23  Starting Weight: 208lb   Weight Lost Since Last Visit: 0lb   Vitals Temp: 98 F (36.7 C) BP: 132/88 Pulse Rate: 100 SpO2: 98 %   Body Composition  Body Fat %: 41.5 % Fat Mass (lbs): 87.2 lbs Muscle Mass (lbs): 116.8 lbs Total Body Water (lbs): 75.6 lbs Visceral Fat Rating : 10    HPI  Chief Complaint: OBESITY  Mary Crosby is here to discuss her progress with her obesity treatment plan. She is on the the [facility] and states she is following her eating plan approximately 50 % of the time. She states she is exercising 30 minutes 2 times per week.  Interval History:  Since last office visit she is up 2 lb She is still eating one larger meal each (dinner) She added in a protein shake while working at night and has done a small healthy meal (2 eggs) or a slice of bread with [place] before going to sleep She is eating a carb with her dinner  She has been eating fruit snacks (a lot) She is still dealing with a lot of job stress with depressed mood  Pharmacotherapy: none  PHYSICAL EXAM:  Blood pressure 132/88, pulse 100, temperature 98 F (36.7 C), height 5' 6.5" (1.689 m), weight 210 lb (95.3 kg), SpO2 98%. Body mass index is 33.39 kg/m.  General: She is overweight, cooperative, alert, well developed, and in no acute distress. PSYCH: Has normal mood, affect and thought process.   Lungs: Normal breathing effort, no conversational dyspnea.   ASSESSMENT AND PLAN  TREATMENT PLAN FOR OBESITY:  Recommended Dietary Goals  [name] is currently in the action stage of change. As such, her goal is to continue weight management plan. She has agreed to keeping a food journal and adhering to recommended goals of 1600 calories and 90+ g of  protein and the Vegetarian Plan.  + fish, chicken  Behavioral Intervention  We discussed the following Behavioral Modification  Strategies today: increasing lean protein intake to established goals, decreasing simple carbohydrates , increasing fiber rich foods, avoiding skipping meals, work on meal planning and preparation, decreasing eating out or consumption of processed foods, and making healthy choices when eating convenient foods, practice mindfulness eating and understand the difference between hunger signals and cravings, work on managing stress, creating time for self-care and relaxation, avoiding temptations and identifying enticing environmental cues, continue to practice mindfulness when eating, planning for success, and continue to work on maintaining a reduced calorie state, getting the recommended amount of protein, incorporating whole foods, making healthy choices, staying well hydrated and practicing mindfulness when eating.. Cut out fruit snacks Replace one skipped meal with a protein shake Keep a light high protein/ low carb meal before bed Cut out candy  Allow fresh fruit 2 serving/ day Allow one quarter plate starch with dinner  Additional resources provided today: NA  Recommended Physical Activity Goals  [name] has been advised to work up to 150 minutes of moderate intensity aerobic activity a week and strengthening exercises 2-3 times per week for cardiovascular health, weight loss maintenance and preservation of muscle mass.   She has agreed to Think about enjoyable ways to increase daily physical activity and overcoming barriers to exercise and Increase physical activity in their day and reduce sedentary time (increase NEAT).  Pharmacotherapy changes for the treatment of obesity: begin metformin  XR 500 mg once daily with food  ASSOCIATED  CONDITIONS ADDRESSED TODAY  Prediabetes Assessment & Plan: Lab Results  Component Value Date   HGBA1C 6.2 (H) 11/20/2023   Reviewed lab results with patient.  She is in the prediabetic range.  She has a positive family history for type 2 diabetes.  She had  previously been over consuming starches and sweets.  She is working on dietary changes.  She has yet to add in regular exercise.  She has never used metformin .  Continue to work on prescribed meal plan. Begin metformin  XR 500 mg once daily with food  Orders: -     metFORMIN  HCl ER; Take 1 tablet (500 mg total) by mouth daily with breakfast.  Dispense: 30 tablet; Refill: 0  Vitamin D  deficiency Assessment & Plan: Last vitamin D  Lab Results  Component Value Date   VD25OH 10.9 (L) 11/20/2023   Reviewed lab results with patient.  She has profound vitamin D  deficiency.  We discussed that it can cause fatigue, bone loss and poor immune function as well as depressed mood. She is currently not on a vitamin D  supplement.  Begin vitamin D  50,000 IU twice weekly Repeat lab in 2 to 3 months  Orders: -     Vitamin D  (Ergocalciferol ); Take 1 capsule (50,000 Units total) by mouth 2 (two) times a week.  Dispense: 10 capsule; Refill: 0  Mixed hyperlipidemia Assessment & Plan: Reviewed lab results with patient.  She has cardiovascular risk factors of smoking and positive family history for cardiovascular disease under the age of 43.  She has never taken medication for cholesterol.  She has only recently started to work on diet and exercise changes.  Lab Results  Component Value Date   CHOL 217 (H) 11/20/2023   HDL 43 11/20/2023   LDLCALC 148 (H) 11/20/2023   TRIG 144 11/20/2023   CHOLHDL 5.0 (H) 11/20/2023   She agrees to treatment with rosuvastatin  5 mg at bedtime once daily.  We discussed potential adverse side effects.  Repeat liver enzymes and LDL in the next 2 to 3 months  The 10-year ASCVD risk score (Arnett DK, et al., 2019) is: 7%   Values used to calculate the score:     Age: 70 years     Sex: Female     Is Non-Hispanic African American: Yes     Diabetic: No     Tobacco smoker: Yes     Systolic Blood Pressure: 132 mmHg     Is BP treated: No     HDL Cholesterol: 43 mg/dL      Total Cholesterol: 217 mg/dL   Orders: -     Rosuvastatin  Calcium ; Take 1 tablet (5 mg total) by mouth daily.  Dispense: 90 tablet; Refill: 3  Class 1 obesity due to excess calories with serious comorbidity and body mass index (BMI) of 33.0 to 33.9 in adult  Shift work sleep disorder Assessment & Plan: Unchanged.  Patient still has broken sleep at night with an overall lack of adequate deep sleep.  This has caused low energy levels and altered her feelings of both hunger and fullness cues.  In addition, she has job stress and is currently looking at changing the location of her job to reduce stress as she still has about 5 years until retirement.  Continue to work on consolidating sleep to 6 hours once daily without interruption  work on her stressors related to work   Anxiety and depression Assessment & Plan: She reports only taking Wellbutrin  XL 150 mg once daily  per her PCP and has not been taking fluoxetine as she has some fear associated with this medication.  She has a good support system at home with her husband but has a lot of job-related stress.  She is looking at making changes to her job location to reduce her level of stress.  This has affected her energy levels, her motivation to make changes and has affected her food choices consuming more high sugar foods and comfort foods.  Continue to work on taking Wellbutrin  XL 150 mg once daily on a consistent basis while working on stress reduction.  Follow-up with PCP for further medication management.   Current every day smoker Assessment & Plan: Improving.  She is still smoking 1/4 to 1/2 pack of cigarettes daily.  She has cut back on her alcohol intake to consuming about 2 alcoholic beverages on the weekends only.  Her smoking is at times associated with alcohol use.  With stress levels being high, she admits to not quite being ready to set a quit date.  Continue to work on mindfulness, stress reduction and reducing number of  cigarettes smoked daily.  Consider setting a quit date in the near future       She was informed of the importance of frequent follow up visits to maximize her success with intensive lifestyle modifications for her multiple health conditions.   ATTESTASTION STATEMENTS:  Reviewed by clinician on day of visit: allergies, medications, problem list, medical history, surgical history, family history, social history, and previous encounter notes pertinent to obesity diagnosis.   I have personally spent 30 minutes total time today in preparation, patient care, nutritional counseling and documentation for this visit, including the following: review of clinical lab tests; review of medical tests/procedures/services.      Luana Rumple, DO DABFM, DABOM [facility] [address] [place], [place] [address] [phone]

## 2023-12-09 NOTE — Assessment & Plan Note (Signed)
 Last vitamin D  Lab Results  Component Value Date   VD25OH 10.9 (L) 11/20/2023   Reviewed lab results with patient.  She has profound vitamin D  deficiency.  We discussed that it can cause fatigue, bone loss and poor immune function as well as depressed mood. She is currently not on a vitamin D  supplement.  Begin vitamin D  50,000 IU twice weekly Repeat lab in 2 to 3 months

## 2023-12-09 NOTE — Assessment & Plan Note (Signed)
 Improving.  She is still smoking 1/4 to 1/2 pack of cigarettes daily.  She has cut back on her alcohol intake to consuming about 2 alcoholic beverages on the weekends only.  Her smoking is at times associated with alcohol use.  With stress levels being high, she admits to not quite being ready to set a quit date.  Continue to work on mindfulness, stress reduction and reducing number of cigarettes smoked daily.  Consider setting a quit date in the near future

## 2023-12-09 NOTE — Assessment & Plan Note (Signed)
 Reviewed lab results with patient.  She has cardiovascular risk factors of smoking and positive family history for cardiovascular disease under the age of 3.  She has never taken medication for cholesterol.  She has only recently started to work on diet and exercise changes.  Lab Results  Component Value Date   CHOL 217 (H) 11/20/2023   HDL 43 11/20/2023   LDLCALC 148 (H) 11/20/2023   TRIG 144 11/20/2023   CHOLHDL 5.0 (H) 11/20/2023   She agrees to treatment with rosuvastatin  5 mg at bedtime once daily.  We discussed potential adverse side effects.  Repeat liver enzymes and LDL in the next 2 to 3 months  The 10-year ASCVD risk score (Arnett DK, et al., 2019) is: 7%   Values used to calculate the score:     Age: 53 years     Sex: Female     Is Non-Hispanic African American: Yes     Diabetic: No     Tobacco smoker: Yes     Systolic Blood Pressure: 132 mmHg     Is BP treated: No     HDL Cholesterol: 43 mg/dL     Total Cholesterol: 217 mg/dL

## 2023-12-09 NOTE — Assessment & Plan Note (Signed)
 Unchanged.  Patient still has broken sleep at night with an overall lack of adequate deep sleep.  This has caused low energy levels and altered her feelings of both hunger and fullness cues.  In addition, she has job stress and is currently looking at changing the location of her job to reduce stress as she still has about 5 years until retirement.  Continue to work on consolidating sleep to 6 hours once daily without interruption  work on her stressors related to work

## 2023-12-30 ENCOUNTER — Encounter: Payer: Self-pay | Admitting: Family Medicine

## 2023-12-30 ENCOUNTER — Ambulatory Visit: Payer: Federal, State, Local not specified - PPO | Admitting: Family Medicine

## 2023-12-30 ENCOUNTER — Telehealth: Payer: Self-pay

## 2023-12-30 ENCOUNTER — Telehealth: Payer: Self-pay | Admitting: *Deleted

## 2023-12-30 ENCOUNTER — Ambulatory Visit (INDEPENDENT_AMBULATORY_CARE_PROVIDER_SITE_OTHER): Admitting: Family Medicine

## 2023-12-30 VITALS — BP 119/83 | HR 87 | Temp 97.8°F | Ht 66.5 in | Wt 211.0 lb

## 2023-12-30 DIAGNOSIS — E559 Vitamin D deficiency, unspecified: Secondary | ICD-10-CM

## 2023-12-30 DIAGNOSIS — E782 Mixed hyperlipidemia: Secondary | ICD-10-CM | POA: Diagnosis not present

## 2023-12-30 DIAGNOSIS — E66811 Obesity, class 1: Secondary | ICD-10-CM

## 2023-12-30 DIAGNOSIS — R7303 Prediabetes: Secondary | ICD-10-CM

## 2023-12-30 DIAGNOSIS — Z6833 Body mass index (BMI) 33.0-33.9, adult: Secondary | ICD-10-CM

## 2023-12-30 MED ORDER — SEMAGLUTIDE(0.25 OR 0.5MG/DOS) 2 MG/3ML ~~LOC~~ SOPN: PEN_INJECTOR | SUBCUTANEOUS | 0 refills | Status: AC

## 2023-12-30 NOTE — Telephone Encounter (Signed)
 Pt is requesting a note for Leave of absence for work from November to December, with reasons for additional care. They wanted additional documentation due to job relations. Something stating that the issues from the job is the reason for being out. Hostile Environment at work.

## 2023-12-30 NOTE — Progress Notes (Signed)
 Office: [phone]  /  Fax: [phone]  WEIGHT SUMMARY AND BIOMETRICS  Starting Date: 11/20/23  Starting Weight: 208lb   Weight Lost Since Last Visit: 0lb   Vitals Temp: 97.8 F (36.6 C) BP: 119/83 Pulse Rate: 87 SpO2: 97 %   Body Composition  Body Fat %: 40.8 % Fat Mass (lbs): 86.4 lbs Muscle Mass (lbs): 118.8 lbs Total Body Water (lbs): 77 lbs Visceral Fat Rating : 10    HPI  Chief Complaint: OBESITY  Mary Crosby is here to discuss her progress with her obesity treatment plan. She is on the the [facility] and states she is following her eating plan approximately 40 % of the time. She states she is exercising 30 minutes 3 times per week.   Interval History:  Since last office visit she is up 1 lb She started metformin XR 500 mg once daily-- taking before work without food She has had GI upset from it  She is tolerating new start Rosuvastatin 5 mg at bedtime She still has a lot of job stress She still has broken sleep (due to having one vehicle) shared with boyfriend She is doing well with foods on plan She has cut back on [employer]  Pharmacotherapy: metformin XR 500 mg daily  PHYSICAL EXAM:  Blood pressure 119/83, pulse 87, temperature 97.8 F (36.6 C), height 5' 6.5" (1.689 m), weight 211 lb (95.7 kg), SpO2 97%. Body mass index is 33.55 kg/m.  General: She is overweight, cooperative, alert, well developed, and in no acute distress. PSYCH: Has normal mood, affect and thought process.   Lungs: Normal breathing effort, no conversational dyspnea.   ASSESSMENT AND PLAN  TREATMENT PLAN FOR OBESITY:  Recommended Dietary Goals  [name] is currently in the action stage of change. As such, her goal is to continue weight management plan. She has agreed to keeping a food journal and adhering to recommended goals of 1600 calories and 100 g of  protein.  Behavioral Intervention  We discussed the following Behavioral Modification Strategies today:  increasing lean protein intake to established goals, increasing fiber rich foods, increasing water intake , work on meal planning and preparation, keeping healthy foods at home, practice mindfulness eating and understand the difference between hunger signals and cravings, work on managing stress, creating time for self-care and relaxation, avoiding temptations and identifying enticing environmental cues, continue to practice mindfulness when eating, and continue to work on maintaining a reduced calorie state, getting the recommended amount of protein, incorporating whole foods, making healthy choices, staying well hydrated and practicing mindfulness when eating..  Additional resources provided today: NA  Recommended Physical Activity Goals  Amere has been advised to work up to 150 minutes of moderate intensity aerobic activity a week and strengthening exercises 2-3 times per week for cardiovascular health, weight loss maintenance and preservation of muscle mass.   She has agreed to Think about enjoyable ways to increase daily physical activity and overcoming barriers to exercise and Increase physical activity in their day and reduce sedentary time (increase NEAT).  Pharmacotherapy changes for the treatment of obesity: stop metformin due to GI upset Begin Ozempic 0.25 mg weekly Patient denies a personal or family history of pancreatitis, medullary thyroid carcinoma or multiple endocrine neoplasia type II. Recommend reviewing pen training video online.   ASSOCIATED CONDITIONS ADDRESSED TODAY  Prediabetes Lab Results  Component Value Date   HGBA1C 6.2 (H) 11/20/2023  Had GI intolerance to metformin Working on weight loss, dietary changes She is interested in [place] use (  has never done GLP-1 RA) Reviewed MOA, potential adverse SE  -     Semaglutide(0.25 or 0.5MG /DOS); 0.25 mg [place] once weekly injection  Dispense: 3 mL; Refill: 0  Class 1 obesity due to excess calories with serious  comorbidity and body mass index (BMI) of 33.0 to 33.9 in adult  Vitamin D deficiency Last vitamin D Lab Results  Component Value Date   VD25OH 10.9 (L) 11/20/2023   She started RX vitamin D 2 x a week Repeat lab in 2 mos  Mixed hyperlipidemia Lab Results  Component Value Date   CHOL 217 (H) 11/20/2023   HDL 43 11/20/2023   LDLCALC 148 (H) 11/20/2023   TRIG 144 11/20/2023   CHOLHDL 5.0 (H) 11/20/2023     Tolerating new start Rosuvastatin 5 mg daily without adverse SE Working on a low saturated fat diet  Repeat lab in 2 mos with LFTs   She was informed of the importance of frequent follow up visits to maximize her success with intensive lifestyle modifications for her multiple health conditions.   ATTESTASTION STATEMENTS:  Reviewed by clinician on day of visit: allergies, medications, problem list, medical history, surgical history, family history, social history, and previous encounter notes pertinent to obesity diagnosis.   I have personally spent 30 minutes total time today in preparation, patient care, nutritional counseling and documentation for this visit, including the following: review of clinical lab tests; review of medical tests/procedures/services.      Glennis Brink, DO DABFM, DABOM [facility] [address] [place], [place] [address] [phone]

## 2023-12-30 NOTE — Telephone Encounter (Signed)
Prior authorization done via cover my meds for patients Ozempic. Waiting on determination.  

## 2023-12-30 NOTE — Telephone Encounter (Signed)
 Called patient and asked her to go to Mychart to request exactly what she needs. She stated that she would do that now. tvt

## 2023-12-31 ENCOUNTER — Other Ambulatory Visit: Payer: Self-pay | Admitting: Family Medicine

## 2023-12-31 DIAGNOSIS — R7303 Prediabetes: Secondary | ICD-10-CM

## 2024-01-02 DIAGNOSIS — F411 Generalized anxiety disorder: Secondary | ICD-10-CM | POA: Diagnosis not present

## 2024-01-02 DIAGNOSIS — F4312 Post-traumatic stress disorder, chronic: Secondary | ICD-10-CM | POA: Diagnosis not present

## 2024-01-02 DIAGNOSIS — F331 Major depressive disorder, recurrent, moderate: Secondary | ICD-10-CM | POA: Diagnosis not present

## 2024-01-02 NOTE — Telephone Encounter (Signed)
 Please let pt know about her denial.

## 2024-01-02 NOTE — Telephone Encounter (Signed)
 Prior authorization denied for patients Ozempic because she does not have type 2 diabetes.

## 2024-01-09 DIAGNOSIS — F331 Major depressive disorder, recurrent, moderate: Secondary | ICD-10-CM | POA: Diagnosis not present

## 2024-01-09 DIAGNOSIS — F411 Generalized anxiety disorder: Secondary | ICD-10-CM | POA: Diagnosis not present

## 2024-01-16 DIAGNOSIS — F331 Major depressive disorder, recurrent, moderate: Secondary | ICD-10-CM | POA: Diagnosis not present

## 2024-01-16 DIAGNOSIS — F4312 Post-traumatic stress disorder, chronic: Secondary | ICD-10-CM | POA: Diagnosis not present

## 2024-01-16 DIAGNOSIS — F411 Generalized anxiety disorder: Secondary | ICD-10-CM | POA: Diagnosis not present

## 2024-01-28 DIAGNOSIS — F331 Major depressive disorder, recurrent, moderate: Secondary | ICD-10-CM | POA: Diagnosis not present

## 2024-01-29 ENCOUNTER — Ambulatory Visit: Admitting: Family Medicine

## 2024-07-22 DIAGNOSIS — N951 Menopausal and female climacteric states: Secondary | ICD-10-CM | POA: Diagnosis not present

## 2024-07-22 DIAGNOSIS — R5382 Chronic fatigue, unspecified: Secondary | ICD-10-CM | POA: Diagnosis not present

## 2024-07-22 DIAGNOSIS — Z1330 Encounter for screening examination for mental health and behavioral disorders, unspecified: Secondary | ICD-10-CM | POA: Diagnosis not present

## 2024-07-22 DIAGNOSIS — E0500 Thyrotoxicosis with diffuse goiter without thyrotoxic crisis or storm: Secondary | ICD-10-CM | POA: Diagnosis not present

## 2024-07-23 ENCOUNTER — Ambulatory Visit
Admission: EM | Admit: 2024-07-23 | Discharge: 2024-07-23 | Disposition: A | Discharge status: Home / Self Care | Attending: Family Medicine | Admitting: Family Medicine

## 2024-07-23 DIAGNOSIS — R109 Unspecified abdominal pain: Secondary | ICD-10-CM | POA: Diagnosis not present

## 2024-07-23 DIAGNOSIS — N3000 Acute cystitis without hematuria: Secondary | ICD-10-CM | POA: Insufficient documentation

## 2024-07-23 LAB — POCT URINALYSIS DIP (MANUAL ENTRY)
Bilirubin, UA: NEGATIVE
Blood, UA: NEGATIVE
Glucose, UA: NEGATIVE mg/dL
Ketones, POC UA: NEGATIVE mg/dL
Nitrite, UA: NEGATIVE
Protein Ur, POC: NEGATIVE mg/dL
Spec Grav, UA: 1.025 (ref 1.010–1.025)
Urobilinogen, UA: 1 U/dL
pH, UA: 6 (ref 5.0–8.0)

## 2024-07-23 MED ORDER — SULFAMETHOXAZOLE-TRIMETHOPRIM 800-160 MG PO TABS: 1.0000 | ORAL_TABLET | Freq: Two times a day (BID) | ORAL | 0 refills | Status: AC

## 2024-07-23 MED ORDER — KETOROLAC TROMETHAMINE 60 MG/2ML IM SOLN
60.0000 mg | Freq: Once | INTRAMUSCULAR | Status: AC
  Administered 2024-07-23: 60 mg via INTRAMUSCULAR

## 2024-07-23 NOTE — Discharge Instructions (Addendum)
 Advised patient to take medication as directed with food to completion.  Encouraged to increase daily water intake to 64 ounces per day while taking this medication.  Advised we will follow-up with urine culture results once received.  Advised if symptoms worsen and/or unresolved please follow-up with your PCP, [facility] urology, or here for further evaluation.

## 2024-07-23 NOTE — ED Triage Notes (Signed)
 Pt c/o RT flank pain x 3 days. Denies constipation, hx of kidney stones or urinary sxs.

## 2024-07-23 NOTE — ED Provider Notes (Signed)
 [facility]    CSN: [id number] Arrival date & time: 07/23/24  1603      History   Chief Complaint Chief Complaint  Patient presents with   Flank Pain    HPI Mary Crosby is a 53 y.o. female.   HPI 53 year old female presents with dysuria and right flank pain x 3 days.  PMH significant for obesity, current everyday cigarette smoker, and back pain.  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   Stress at work 08/30/2023   Close exposure to COVID-19 virus 06/26/2023   Encounter for completion of form with patient 04/18/2023   Depression, major, single episode, moderate (HCC) 04/18/2023   GAD (generalized anxiety disorder) 04/18/2023   Peri-menopause 04/18/2023   Pain of left calf 04/18/2023   Smoking greater than 30 pack years 04/18/2023   Graves disease 07/04/2016   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 Authorizing Provider  sulfamethoxazole -trimethoprim  (BACTRIM  DS) 800-160 MG tablet Take 1 tablet by mouth 2 (two) times daily for 3 days. 07/23/24 07/26/24 Yes Teddy Sharper, FNP  buPROPion  (WELLBUTRIN  XL) 150 MG 24 hr tablet Take 1 tablet (150 mg total) by mouth daily. 09/20/23   Booker Darice SAUNDERS, FNP  FLUoxetine (PROZAC) 10 MG capsule Take 10 mg by mouth daily. 11/11/23   [provider]  rosuvastatin  (CRESTOR ) 5 MG tablet Take 1 tablet (5 mg total) by mouth daily. 12/09/23   Bowen, Darice BRAVO, DO  Semaglutide ,0.25 or 0.5MG /DOS, 2 MG/3ML SOPN 0.25 mg [place] once weekly injection 12/30/23   Bowen, Darice BRAVO, DO  Vitamin D , Ergocalciferol , (DRISDOL ) 1.25 MG (50000 UNIT) CAPS capsule Take 1 capsule (50,000 Units total) by mouth 2 (two) times a week. 12/09/23   Bowen, Darice BRAVO, DO    Family History Family History  Problem Relation Age of Onset   Diabetes Mother    Coronary  artery disease Mother    Depression Mother    Diabetes Father    Lung disease Father    Kidney disease Father    Colon cancer Neg Hx    Esophageal cancer Neg Hx    Colon polyps Neg Hx    Rectal cancer Neg Hx    Stomach cancer Neg Hx     Social History Social History   Tobacco Use   Smoking status: Every Day    Current packs/day: 1.00    Average packs/day: 1 pack/day for 35.0 years (35.0 ttl pk-yrs)    Types: Cigarettes   Smokeless tobacco: Never  Vaping Use   Vaping status: Never Used  Substance Use Topics   Alcohol use: Not Currently   Drug use: Never     Allergies   Penicillins   Review of Systems Review of Systems  Genitourinary:  Positive for dysuria and flank pain.     Physical Exam Triage Vital Signs ED Triage Vitals  Encounter Vitals Group     BP      Girls Systolic BP Percentile      Girls Diastolic BP Percentile      Boys Systolic BP Percentile      Boys Diastolic BP Percentile      Pulse      Resp      Temp      Temp src       SpO2      Weight      Height      Head Circumference      Peak Flow      Pain Score      Pain Loc      Pain Education      Exclude from Growth Chart    No data found.  Updated Vital Signs BP 110/79 (BP Location: Right Arm)   Pulse 97   Temp 98.1 F (36.7 C) (Oral)   Resp 17   SpO2 97%    Physical Exam Vitals and nursing note reviewed.  Constitutional:      Appearance: Normal appearance. She is normal weight.  HENT:     Head: Normocephalic and atraumatic.     Mouth/Throat:     Mouth: Mucous membranes are moist.     Pharynx: Oropharynx is clear.  Eyes:     Extraocular Movements: Extraocular movements intact.     Conjunctiva/sclera: Conjunctivae normal.     Pupils: Pupils are equal, round, and reactive to light.  Cardiovascular:     Rate and Rhythm: Normal rate and regular rhythm.     Pulses: Normal pulses.     Heart sounds: Normal heart sounds.  Pulmonary:     Effort: Pulmonary effort is normal.     Breath sounds: Normal breath sounds. No wheezing, rhonchi or rales.  Musculoskeletal:        General: Normal range of motion.  Skin:    General: Skin is warm and dry.  Neurological:     General: No focal deficit present.     Mental Status: She is alert and oriented to person, place, and time. Mental status is at baseline.  Psychiatric:        Mood and Affect: Mood normal.        Behavior: Behavior normal.        Thought Content: Thought content normal.      UC Treatments / Results  Labs (all labs ordered are listed, but only abnormal results are displayed) Labs Reviewed  POCT URINALYSIS DIP (  MANUAL ENTRY) - Abnormal; Notable for the following components:      Result Value   Leukocytes, UA Trace (*)    All other components within normal limits  URINE CULTURE    EKG   Radiology No results found.  Procedures Procedures (including critical care time)  Medications Ordered in UC Medications  ketorolac  (TORADOL ) injection 60 mg (60 mg Intramuscular  Given 07/23/24 1657)    Initial Impression / Assessment and Plan / UC Course  I have reviewed the triage vital signs and the nursing notes.  Pertinent labs & imaging results that were available during my care of the patient were reviewed by me and considered in my medical decision making (see chart for details).     MDM: 1.  Acute cystitis without hematuria-UA revealed above, urine culture ordered, Rx'd Bactrim  DS 800-160 mg tablet: Take 1 tablet twice daily x 3 days; 2.  Right flank pain-IM Toradol  60 mg given once in clinic. Advised patient to take medication as directed with food to completion.  Encouraged to increase daily water intake to 64 ounces per day while taking this medication.  Advised we will follow-up with urine culture results once received.  Advised if symptoms worsen and/or unresolved please follow-up with your PCP, [facility] urology, or here for further evaluation.  Work note provided to patient prior to discharge per request.  Patient discharged home, hemodynamically stable Final Clinical Impressions(s) / UC Diagnoses   Final diagnoses:  Acute cystitis without hematuria  Right flank pain     Discharge Instructions      Advised patient to take medication as directed with food to completion.  Encouraged to increase daily water intake to 64 ounces per day while taking this medication.  Advised we will follow-up with urine culture results once received.  Advised if symptoms worsen and/or unresolved please follow-up with your PCP, [facility] urology, or here for further evaluation.     ED Prescriptions     Medication Sig Dispense Auth. Provider   sulfamethoxazole -trimethoprim  (BACTRIM  DS) 800-160 MG tablet Take 1 tablet by mouth 2 (two) times daily for 3 days. 6 tablet Bilal Manzer, FNP      PDMP not reviewed this encounter.   Teddy Sharper, FNP 07/23/24 1710

## 2024-07-24 ENCOUNTER — Telehealth: Payer: Self-pay

## 2024-07-24 NOTE — Telephone Encounter (Signed)
 Called to check on patient. Doing better. No needs from ucc.

## 2024-07-25 LAB — URINE CULTURE: Culture: NO GROWTH

## 2024-10-15 ENCOUNTER — Ambulatory Visit: Admitting: Family Medicine

## 2024-10-15 ENCOUNTER — Ambulatory Visit: Payer: Self-pay

## 2024-10-15 ENCOUNTER — Encounter: Payer: Self-pay | Admitting: Family Medicine

## 2024-10-15 VITALS — BP 116/78 | HR 82 | Temp 97.7°F | Ht 67.0 in | Wt 213.7 lb

## 2024-10-15 DIAGNOSIS — F17210 Nicotine dependence, cigarettes, uncomplicated: Secondary | ICD-10-CM | POA: Insufficient documentation

## 2024-10-15 DIAGNOSIS — E559 Vitamin D deficiency, unspecified: Secondary | ICD-10-CM

## 2024-10-15 DIAGNOSIS — M791 Myalgia, unspecified site: Secondary | ICD-10-CM | POA: Diagnosis not present

## 2024-10-15 DIAGNOSIS — Z1231 Encounter for screening mammogram for malignant neoplasm of breast: Secondary | ICD-10-CM

## 2024-10-15 DIAGNOSIS — R5383 Other fatigue: Secondary | ICD-10-CM | POA: Diagnosis not present

## 2024-10-15 DIAGNOSIS — R0683 Snoring: Secondary | ICD-10-CM | POA: Diagnosis not present

## 2024-10-15 NOTE — Progress Notes (Signed)
° °  Acute Office Visit  Subjective:     Patient ID: Mary Crosby, female    DOB: [birth year], 53 y.o.   MRN: [id number]  Chief Complaint  Patient presents with   Fatigue    HPI Patient is in today for fatigue, joint and muscle pain.  Pain is worse in the mornings.  Symptoms present for two months.   Can sleep > 8 hours and still be fatigued. Snores loudly.  Needs sleep study.  Would like to get tested for lupus today: ANA ordered History of vitamin d  deficiency: will check CBC and vitamin D  ordered.  Endorses thirst and increased urination.  Has pre-diabetes, will monitor blood sugar at home, record numbers and bring back at CPE with labs with pap smear. Will check A1C at CPE.  Referrals today: Sleep study for snoring and fatigue.  Mammogram for breast cancer screening.  Low dose CT scan for lung cancer screening.  Return for pap smear and screening labs.  Labs today: ANA, CBC, vitamin D      ROS      Objective:    BP 116/78 (BP Location: Left Arm, Patient Position: Sitting, Cuff Size: Normal)   Pulse 82   Temp 97.7 F (36.5 C) (Oral)   Ht 5' 7 (1.702 m)   Wt 213 lb 11.2 oz (96.9 kg)   LMP  (LMP Unknown)   SpO2 97%   BMI 33.47 kg/m    Physical Exam Vitals and nursing note reviewed.  Constitutional:      General: She is not in acute distress.    Appearance: Normal appearance.  Cardiovascular:     Rate and Rhythm: Normal rate and regular rhythm.     Heart sounds: Normal heart sounds.  Pulmonary:     Effort: Pulmonary effort is normal.     Breath sounds: Normal breath sounds.  Skin:    General: Skin is warm and dry.  Neurological:     General: No focal deficit present.     Mental Status: She is alert. Mental status is at baseline.  Psychiatric:        Mood and Affect: Mood normal.        Behavior: Behavior normal.        Thought Content: Thought content normal.        Judgment: Judgment normal.     No results found for any visits on  10/15/24.      Assessment & Plan:   Problem List Items Addressed This Visit     Vitamin D  deficiency   Relevant Orders   VITAMIN D  25 Hydroxy (Vit-D Deficiency, Fractures)   Muscle pain - Primary   Relevant Orders   ANA   Other fatigue   Relevant Orders   CBC   ANA   Cigarette smoker   Relevant Orders   CT CHEST LUNG CANCER SCREENING LOW DOSE WO CONTRAST   Loud snoring   Relevant Orders   Ambulatory referral to Sleep Studies   Other Visit Diagnoses       Encounter for screening mammogram for malignant neoplasm of breast       Relevant Orders   MM DIGITAL SCREENING BILATERAL     Agrees with plan of care discussed.  Questions answered.     Return in about 13 days (around 10/28/2024) for CPE with labs with pap smear (40 mintues) .  Mary JONELLE Brownie, FNP

## 2024-10-15 NOTE — Telephone Encounter (Signed)
 FYI Only or Action Required?: FYI only for provider: appointment scheduled on 10/15/24.  Patient was last seen in primary care on 12/30/2023 by Waylan Darice BRAVO, DO.  Called Nurse Triage reporting Fatigue.  Symptoms began about a month ago.  Interventions attempted: Nothing.  Symptoms are: unchanged.  Triage Disposition: See Physician Within 24 Hours  Patient/caregiver understands and will follow disposition?: Yes   Copied from CRM #[id number]. Topic: Clinical - Red Word Triage >> Oct 15, 2024 10:07 AM Mary Crosby wrote: Red Word that prompted transfer to Nurse Triage: Headaches, joint and Muscle pain, extreme fatigue, stomach pain, kidney issues. Pt did visit urgent care and was given medication for kidney issues. Pt would like to see if she has Lupus Reason for Disposition  [1] MODERATE weakness (e.g., interferes with work, school, normal activities) AND [2] persists > 3 days  Answer Assessment - Initial Assessment Questions Scheduled 10/15/24 Advised call back or ED/911 if symptoms worsen.  Patient requesting to be checked for Lupus; family hx.  1. DESCRIPTION: Describe how you are feeling.     Feels really tired; sleeping 10-12 hours, still extemly tired when waking up muscle aches; hurts when walking, tingling in toes and hands,numbness comes and goes when laying down 2. SEVERITY: How bad is it?  Can you stand and walk?     Able to stand and walk 3. ONSET: When did these symptoms begin? (e.g., hours, days, weeks, months)     Month ago 4. CAUSE: What do you think is causing the weakness or fatigue? (e.g., not drinking enough fluids, medical problem, trouble sleeping)     Thinks could be weight, put on weight 5. NEW MEDICINES:  Have you started on any new medicines recently? (e.g., opioid pain medicines, benzodiazepines, muscle relaxants, antidepressants, antihistamines, neuroleptics, beta blockers)     denies 6. OTHER SYMPTOMS: Do you have any other symptoms? (e.g.,  chest pain, fever, cough, SOB, vomiting, diarrhea, bleeding, other areas of pain)     Denies diff breathing, chest pain, abd pain, fever, chills, n/v abdominal pain comes and goes, sob with exertion  able to eat and drink   urinating without diff  bm without diff, last bm yesterday, normal; blood in toilet; hemorrhoids  Protocols used: Weakness (Generalized) and Fatigue-A-AH

## 2024-10-15 NOTE — Patient Instructions (Signed)
Target blood sugar goals:   Fasting glucose 80-130 mg/dl Postprandial glucose (90-120 minutes after the meal) less than 180 mg/dl

## 2024-10-16 LAB — ENA+DNA/DS+ANTICH+CENTRO+FA...
Anti JO-1: <0.2 AI (ref 0.0–0.9)
Centromere Ab Screen: <0.2 AI (ref 0.0–0.9)
Chromatin Ab SerPl-aCnc: <0.2 AI (ref 0.0–0.9)
ENA RNP Ab: >8 AI — ABNORMAL HIGH (ref 0.0–0.9)
ENA SM Ab Ser-aCnc: 0.5 AI (ref 0.0–0.9)
ENA SSA (RO) Ab: <0.2 AI (ref 0.0–0.9)
ENA SSB (LA) Ab: 0.2 AI (ref 0.0–0.9)
Nucleolar Pattern: 1:640 {titer} — ABNORMAL HIGH
Scleroderma (Scl-70) (ENA) Antibody, IgG: <0.2 AI (ref 0.0–0.9)
dsDNA Ab: 1 [IU]/mL (ref 0–9)

## 2024-10-16 LAB — CBC
Hematocrit: 43.8 % (ref 34.0–46.6)
Hemoglobin: 15 g/dL (ref 11.1–15.9)
MCH: 33.3 pg — ABNORMAL HIGH (ref 26.6–33.0)
MCHC: 34.2 g/dL (ref 31.5–35.7)
MCV: 97 fL (ref 79–97)
Platelets: 425 x10E3/uL (ref 150–450)
RBC: 4.5 x10E6/uL (ref 3.77–5.28)
RDW: 12.1 % (ref 11.7–15.4)
WBC: 6.6 x10E3/uL (ref 3.4–10.8)

## 2024-10-16 LAB — VITAMIN D 25 HYDROXY (VIT D DEFICIENCY, FRACTURES): Vit D, 25-Hydroxy: 30.7 ng/mL (ref 30.0–100.0)

## 2024-10-16 LAB — ANA: ANA Titer 1: POSITIVE — AB

## 2024-10-18 ENCOUNTER — Ambulatory Visit: Payer: Self-pay | Admitting: Family Medicine

## 2024-10-18 DIAGNOSIS — R7689 Other specified abnormal immunological findings in serum: Secondary | ICD-10-CM | POA: Insufficient documentation

## 2024-10-26 ENCOUNTER — Ambulatory Visit

## 2024-10-26 DIAGNOSIS — I700 Atherosclerosis of aorta: Secondary | ICD-10-CM | POA: Diagnosis not present

## 2024-10-26 DIAGNOSIS — F17210 Nicotine dependence, cigarettes, uncomplicated: Secondary | ICD-10-CM | POA: Diagnosis not present

## 2024-10-26 DIAGNOSIS — J432 Centrilobular emphysema: Secondary | ICD-10-CM | POA: Diagnosis not present

## 2024-10-28 ENCOUNTER — Encounter: Admitting: Family Medicine

## 2024-11-05 ENCOUNTER — Telehealth: Payer: Self-pay

## 2024-11-05 NOTE — Telephone Encounter (Signed)
 Reason for CRM: Channing from [facility] is calling to speak with a nurse regarding Mary Crosby results. She was only able to verify the patient's name, date of birth, and MRN. Channing is requesting a callback at [phone]

## 2024-11-05 NOTE — Telephone Encounter (Signed)
 [facility] and confirmed we are able to see Radiologist impression on Lung Cancer Screening ordered by Darice Brownie. For further confirmation I asked radiologist to confirm impression. Everything was as seen under imaging results.

## 2024-11-06 NOTE — Telephone Encounter (Signed)
 Please call to see what this is about. Provider saw CT scan results with suggested follow-up. This there another concern. Darice    Copied from [employer] [id number]. Topic: Clinical - Lab/Test Results >> Nov 04, 2024  3:35 PM Ivette P wrote: Reason for CRM: Channing called in to speak to nurse regarding critical report. Attempted to call [facility], no answer.   Please call back to hear critical report that value or to let us  know they have it electronically

## 2024-11-09 NOTE — Telephone Encounter (Signed)
 Contacted the patient regarding their lab results from 10/18/24 and the CT results. During the call, she was made aware she will need to transition care to a new provider since provider is no longer with the clinic. The patient verbalized understanding of the results and CT follow up recommendation. The patient was provided with the contact information for the Rheumatology office. No other inquiries during the call.

## 2024-11-09 NOTE — Telephone Encounter (Signed)
 This task has been completed as requested. Please review other TE for additional information. No further action needed.

## 2025-02-15 ENCOUNTER — Ambulatory Visit
